# Patient Record
Sex: Female | Born: 1950
Health system: Southern US, Community
[De-identification: ages and names within clinical notes are randomized; demographics above are authoritative.]

## PROBLEM LIST (undated history)

## (undated) DIAGNOSIS — E079 Disorder of thyroid, unspecified: Secondary | ICD-10-CM

## (undated) DIAGNOSIS — I10 Essential (primary) hypertension: Secondary | ICD-10-CM

## (undated) DIAGNOSIS — Z9889 Other specified postprocedural states: Secondary | ICD-10-CM

## (undated) DIAGNOSIS — R112 Nausea with vomiting, unspecified: Secondary | ICD-10-CM

## (undated) DIAGNOSIS — E119 Type 2 diabetes mellitus without complications: Secondary | ICD-10-CM

## (undated) DIAGNOSIS — K219 Gastro-esophageal reflux disease without esophagitis: Secondary | ICD-10-CM

## (undated) HISTORY — PX: WISDOM TOOTH EXTRACTION: SHX21

## (undated) HISTORY — DX: Disorder of thyroid, unspecified: E07.9

---

## 1999-06-04 ENCOUNTER — Other Ambulatory Visit: Admission: RE | Admit: 1999-06-04 | Discharge: 1999-06-04 | Payer: Self-pay | Admitting: Obstetrics and Gynecology

## 2000-10-22 ENCOUNTER — Other Ambulatory Visit: Admission: RE | Admit: 2000-10-22 | Discharge: 2000-10-22 | Payer: Self-pay | Admitting: Obstetrics and Gynecology

## 2000-11-06 ENCOUNTER — Encounter: Admission: RE | Admit: 2000-11-06 | Discharge: 2001-02-04 | Payer: Self-pay | Admitting: Family Medicine

## 2002-04-01 ENCOUNTER — Other Ambulatory Visit: Admission: RE | Admit: 2002-04-01 | Discharge: 2002-04-01 | Payer: Self-pay | Admitting: Obstetrics and Gynecology

## 2003-07-22 ENCOUNTER — Ambulatory Visit (HOSPITAL_COMMUNITY): Admission: RE | Admit: 2003-07-22 | Discharge: 2003-07-22 | Payer: Self-pay | Admitting: Gastroenterology

## 2003-11-10 ENCOUNTER — Other Ambulatory Visit: Admission: RE | Admit: 2003-11-10 | Discharge: 2003-11-10 | Payer: Self-pay | Admitting: Obstetrics and Gynecology

## 2004-12-17 ENCOUNTER — Other Ambulatory Visit: Admission: RE | Admit: 2004-12-17 | Discharge: 2004-12-17 | Payer: Self-pay | Admitting: Obstetrics and Gynecology

## 2005-04-01 ENCOUNTER — Ambulatory Visit (HOSPITAL_COMMUNITY): Admission: RE | Admit: 2005-04-01 | Discharge: 2005-04-01 | Payer: Self-pay | Admitting: Obstetrics and Gynecology

## 2005-12-23 ENCOUNTER — Other Ambulatory Visit: Admission: RE | Admit: 2005-12-23 | Discharge: 2005-12-23 | Payer: Self-pay | Admitting: Obstetrics and Gynecology

## 2007-01-05 ENCOUNTER — Ambulatory Visit (HOSPITAL_COMMUNITY): Admission: RE | Admit: 2007-01-05 | Discharge: 2007-01-05 | Payer: Self-pay | Admitting: Obstetrics and Gynecology

## 2007-01-07 ENCOUNTER — Ambulatory Visit (HOSPITAL_COMMUNITY): Admission: RE | Admit: 2007-01-07 | Discharge: 2007-01-07 | Payer: Self-pay | Admitting: Obstetrics and Gynecology

## 2008-01-19 ENCOUNTER — Ambulatory Visit (HOSPITAL_COMMUNITY): Admission: RE | Admit: 2008-01-19 | Discharge: 2008-01-19 | Payer: Self-pay | Admitting: Obstetrics and Gynecology

## 2009-02-07 ENCOUNTER — Encounter: Admission: RE | Admit: 2009-02-07 | Discharge: 2009-02-07 | Payer: Self-pay | Admitting: Obstetrics and Gynecology

## 2009-11-11 HISTORY — PX: CATARACT EXTRACTION W/ INTRAOCULAR LENS  IMPLANT, BILATERAL: SHX1307

## 2010-02-09 ENCOUNTER — Ambulatory Visit (HOSPITAL_COMMUNITY): Admission: RE | Admit: 2010-02-09 | Discharge: 2010-02-09 | Payer: Self-pay | Admitting: Obstetrics and Gynecology

## 2011-01-15 ENCOUNTER — Other Ambulatory Visit (HOSPITAL_COMMUNITY): Payer: Self-pay | Admitting: Obstetrics and Gynecology

## 2011-01-15 DIAGNOSIS — Z1231 Encounter for screening mammogram for malignant neoplasm of breast: Secondary | ICD-10-CM

## 2011-02-12 ENCOUNTER — Ambulatory Visit (HOSPITAL_COMMUNITY)
Admission: RE | Admit: 2011-02-12 | Discharge: 2011-02-12 | Disposition: A | Discharge status: Home / Self Care | Payer: 59 | Source: Ambulatory Visit | Attending: Obstetrics and Gynecology | Admitting: Obstetrics and Gynecology

## 2011-02-12 DIAGNOSIS — Z1231 Encounter for screening mammogram for malignant neoplasm of breast: Secondary | ICD-10-CM | POA: Insufficient documentation

## 2011-03-29 NOTE — Op Note (Signed)
   NAME:  Kiara Kelley, Kiara Kelley                          ACCOUNT NO.:  [id number]   MEDICAL RECORD NO.:  [id number]                   PATIENT TYPE:  AMB   LOCATION:  ENDO                                 FACILITY:  [facility]   PHYSICIAN:  Danise Edge, M.D.                DATE OF BIRTH:  13-Nov-1950   DATE OF PROCEDURE:  07/22/2003  DATE OF DISCHARGE:                                 OPERATIVE REPORT   PROCEDURE:  Screening colonoscopy.   INDICATIONS FOR PROCEDURE:  Kiara Kelley is a 60 year old scheduled to  undergo her first screening colonoscopy and polypectomy to prevent colon  cancer.   ENDOSCOPIST:  Charolett Bumpers, M.D.   PREMEDICATION:  Versed 10 mg, Demerol 50 mg .   DESCRIPTION OF PROCEDURE:  After obtaining informed consent, [name] was  placed in the left lateral decubitus position. I administered intravenous  Demerol and intravenous Versed to achieve conscious sedation for the  procedure. The patient's blood pressure, oxygen saturation and cardiac  rhythm were monitored throughout the procedure and documented in the medical  record.   Anal inspection was normal. Digital rectal exam was normal. The Olympus  pediatric colonoscope was introduced into the rectum and with some  difficulty, due to left colon loop formation, the tip of the instrument  eventually advanced to the cecum. Colonic preparation for the exam today was  satisfactory.   RECTUM:  Normal.   SIGMOID COLON AND DESCENDING COLON:  A few colonic diverticula are present  without diverticulitis or diverticular stricture formation.   SPLENIC FLEXURE:  Normal.   TRANSVERSE COLON:  Normal.   HEPATIC FLEXURE:  Normal.   ASCENDING COLON:  Normal.   CECUM AND ILEOCECAL VALVE:  Normal.    ASSESSMENT:  Normal screening proctocolonoscopy to the cecum. No endoscopic  evidence for the presence of colorectal neoplasia. A few small diverticula  noted in the left colon.        Danise Edge, M.D.    MJ/MEDQ  D:  07/22/2003  T:  07/23/2003  Job:  [id number]   cc:   Hal Morales, M.D.  [address]  [place]  [place] [address]  Fax: [phone]   Donia Guiles, M.D.  [address] [place]  [place] [address]  Fax: [phone]

## 2012-02-19 ENCOUNTER — Other Ambulatory Visit: Payer: Self-pay | Admitting: Obstetrics and Gynecology

## 2012-02-19 DIAGNOSIS — Z1231 Encounter for screening mammogram for malignant neoplasm of breast: Secondary | ICD-10-CM

## 2012-03-12 ENCOUNTER — Encounter: Payer: Self-pay | Admitting: Obstetrics and Gynecology

## 2012-03-16 ENCOUNTER — Ambulatory Visit (HOSPITAL_COMMUNITY)
Admission: RE | Admit: 2012-03-16 | Discharge: 2012-03-16 | Disposition: A | Discharge status: Home / Self Care | Payer: 59 | Source: Ambulatory Visit | Attending: Obstetrics and Gynecology | Admitting: Obstetrics and Gynecology

## 2012-03-16 DIAGNOSIS — Z1231 Encounter for screening mammogram for malignant neoplasm of breast: Secondary | ICD-10-CM | POA: Insufficient documentation

## 2012-06-03 ENCOUNTER — Encounter: Payer: Self-pay | Admitting: Obstetrics and Gynecology

## 2012-11-01 ENCOUNTER — Ambulatory Visit: Payer: 59 | Admitting: Family Medicine

## 2012-11-01 ENCOUNTER — Ambulatory Visit: Payer: 59

## 2012-11-01 VITALS — BP 132/66 | HR 94 | Temp 99.4°F | Resp 16 | Ht 63.5 in | Wt 166.0 lb

## 2012-11-01 DIAGNOSIS — J111 Influenza due to unidentified influenza virus with other respiratory manifestations: Secondary | ICD-10-CM

## 2012-11-01 DIAGNOSIS — R05 Cough: Secondary | ICD-10-CM

## 2012-11-01 DIAGNOSIS — R059 Cough, unspecified: Secondary | ICD-10-CM

## 2012-11-01 DIAGNOSIS — E119 Type 2 diabetes mellitus without complications: Secondary | ICD-10-CM

## 2012-11-01 DIAGNOSIS — R6889 Other general symptoms and signs: Secondary | ICD-10-CM

## 2012-11-01 LAB — POCT CBC
Lymph, poc: 0.8 (ref 0.6–3.4)
MID (cbc): 0.4 (ref 0–0.9)
MPV: 9.4 fL (ref 0–99.8)
POC LYMPH PERCENT: 13.9 %L (ref 10–50)
Platelet Count, POC: 246 10*3/uL (ref 142–424)
RBC: 4.6 M/uL (ref 4.04–5.48)
RDW, POC: 13.2 %

## 2012-11-01 LAB — POCT INFLUENZA A/B: Influenza A, POC: NEGATIVE

## 2012-11-01 LAB — GLUCOSE, POCT (MANUAL RESULT ENTRY): POC Glucose: 139 mg/dl — AB (ref 70–99)

## 2012-11-01 MED ORDER — IPRATROPIUM BROMIDE 0.03 % NA SOLN: 2.0000 | Freq: Two times a day (BID) | NASAL | Status: DC

## 2012-11-01 MED ORDER — MUCINEX DM 30-600 MG PO TB12: 1.0000 | ORAL_TABLET | Freq: Two times a day (BID) | ORAL | Status: DC

## 2012-11-01 MED ORDER — AZITHROMYCIN 250 MG PO TABS: ORAL_TABLET | ORAL | Status: DC

## 2012-11-01 NOTE — Patient Instructions (Addendum)
1. Cough  POCT CBC, POCT glucose (manual entry), DG Chest 2 View  2. Flu-like symptoms  POCT rapid strep A, POCT Influenza A/B

## 2012-11-01 NOTE — Progress Notes (Signed)
[address]   [place], [place]  [address]   [phone]  Subjective:    Patient ID: Kiara Kelley, female    DOB: [birth year], 61 y.o.   MRN: [id number]  HPIThis 61 y.o. female presents for evaluation of acute illness.  Onset two days ago. Coughed all night long with every movement; extremely sore in chest from coughing.  No fever but +chills; no sweats.   +HA.  No ear pain but +congestion.  +ST; no pain with swallowing; +irritated.  No rhinorrhea; +congestion; +PND.  +coughing a lot.  No sputum.  No SOB.  Dry hacking cough.  No v/d.  +nausea.  S/p flu vaccine.  Delsym and Advil sinus congestion.  +sick contacts at work with strep, GI bug, cough.  Did not check blood sugar this morning.   Review of Systems  Constitutional: Positive for chills and fatigue. Negative for fever and diaphoresis.  HENT: Positive for congestion, sore throat, rhinorrhea, voice change and postnasal drip. Negative for ear pain, trouble swallowing and sinus pressure.   Respiratory: Positive for cough. Negative for shortness of breath and wheezing.   Gastrointestinal: Positive for nausea. Negative for vomiting and diarrhea.  Skin: Negative for rash.  Neurological: Negative for dizziness, light-headedness and headaches.    Past Medical History  Diagnosis Date  . Diabetes mellitus without complication     History reviewed. No pertinent past surgical history.  Prior to Admission medications   Medication Sig Start Date End Date Taking? Authorizing Provider  colesevelam (WELCHOL) 625 MG tablet Take 1,875 mg by mouth 2 (two) times daily with a meal.   Yes Historical Provider, MD  ezetimibe-simvastatin (VYTORIN) 10-20 MG per tablet Take 1 tablet by mouth at bedtime.   Yes Historical Provider, MD  glyBURIDE-metformin (GLUCOVANCE) 1.25-250 MG per tablet Take 2 tablets by mouth 2 (two) times daily.   Yes Historical Provider, MD  lisinopril (PRINIVIL,ZESTRIL) 10 MG tablet Take 10 mg by mouth daily.   Yes Historical  Provider, MD  sitaGLIPtin (JANUVIA) 100 MG tablet Take 100 mg by mouth daily.   Yes Historical Provider, MD    No Known Allergies  History   Social History  . Marital Status: Married    Spouse Name: N/A    Number of Children: N/A  . Years of Education: N/A   Occupational History  . Not on file.   Social History Main Topics  . Smoking status: Never Smoker   . Smokeless tobacco: Not on file  . Alcohol Use: No  . Drug Use: No  . Sexually Active: Yes   Other Topics Concern  . 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.    Family History  Problem Relation Age of Onset  . Heart disease Mother   . Lung cancer Father   . Diabetes Brother         Objective:   Physical Exam  Nursing note and vitals reviewed. Constitutional: She is oriented to person, place, and time. She appears well-developed and well-nourished. No distress.  HENT:  Head: Normocephalic and atraumatic.  Right Ear: External ear normal.  Left Ear: External ear normal.  Nose: Nose normal.  Mouth/Throat: Oropharynx is clear and moist. No oropharyngeal exudate.  Eyes: Conjunctivae normal are normal. Pupils are equal, round, and reactive to light.  Neck: Normal range of motion. Neck supple.  Cardiovascular: Normal rate, regular rhythm and normal heart sounds.   No murmur heard. Pulmonary/Chest: Effort normal and  breath sounds normal. No respiratory distress. She has no wheezes. She has no rales.  Lymphadenopathy:    She has no cervical adenopathy.  Neurological: She is alert and oriented to person, place, and time.  Skin: Skin is warm and dry. No rash noted. She is not diaphoretic.  Psychiatric: She has a normal mood and affect. Her behavior is normal.   Results for orders placed in visit on 11/01/12  POCT CBC      Component Value Range   WBC 5.7  4.6 - 10.2 K/uL   Lymph, poc 0.8  0.6 - 3.4   POC LYMPH PERCENT 13.9  10 - 50 %L   MID (cbc) 0.4  0 - 0.9   POC MID % 6.6   0 - 12 %M   POC Granulocyte 4.5  2 - 6.9   Granulocyte percent 79.5  37 - 80 %G   RBC 4.60  4.04 - 5.48 M/uL   Hemoglobin 12.7  12.2 - 16.2 g/dL   HCT, POC 45.4  09.8 - 47.9 %   MCV 90.5  80 - 97 fL   MCH, POC 27.6  27 - 31.2 pg   MCHC 30.5 (*) 31.8 - 35.4 g/dL   RDW, POC 11.9     Platelet Count, POC 246  142 - 424 K/uL   MPV 9.4  0 - 99.8 fL  GLUCOSE, POCT (MANUAL RESULT ENTRY)      Component Value Range   POC Glucose 139 (*) 70 - 99 mg/dl  POCT RAPID STREP A (OFFICE)      Component Value Range   Rapid Strep A Screen Negative  Negative  POCT INFLUENZA A/B      Component Value Range   Influenza A, POC Negative     Influenza B, POC Negative     [facility] reading (PRIMARY) by  Dr. Katrinka Blazing.  CXR: NAD      Assessment & Plan:   1. Cough  POCT CBC, POCT glucose (manual entry), DG Chest 2 View  2. Flu-like symptoms  POCT rapid strep A, POCT Influenza A/B  3. Type II or unspecified type diabetes mellitus without mention of complication, not stated as uncontrolled       1.  URI:  New.  Treat supportively with rest, fluids, Ibuprofen or Tylenol. Rx for Zithromax and Mucinex DM and Atrovent nasal spray.  Likely viral syndrome yet due to DMII, exposure to multiple immunosuppressed family members, will treat with Zithromax. 2.  DMII:  Stable/controlled; advised to monitor sugars closely with acute illness.    Meds ordered this encounter  Medications  . ezetimibe-simvastatin (VYTORIN) 10-20 MG per tablet    Sig: Take 1 tablet by mouth at bedtime.  Marland Kitchen lisinopril (PRINIVIL,ZESTRIL) 10 MG tablet    Sig: Take 10 mg by mouth daily.  Marland Kitchen glyBURIDE-metformin (GLUCOVANCE) 1.25-250 MG per tablet    Sig: Take 2 tablets by mouth 2 (two) times daily.  . sitaGLIPtin (JANUVIA) 100 MG tablet    Sig: Take 100 mg by mouth daily.  . colesevelam (WELCHOL) 625 MG tablet    Sig: Take 1,875 mg by mouth 2 (two) times daily with a meal.  . azithromycin (ZITHROMAX) 250 MG tablet    Sig: Two tablets daily x 5 days     Dispense:  10 tablet    Refill:  0  . Dextromethorphan-Guaifenesin (MUCINEX DM) 30-600 MG TB12    Sig: Take 1 tablet by mouth 2 (two) times daily.    Dispense:  28  each    Refill:  0  . ipratropium (ATROVENT) 0.03 % nasal spray    Sig: Place 2 sprays into the nose 2 (two) times daily.    Dispense:  30 mL    Refill:  5

## 2012-11-06 NOTE — Progress Notes (Signed)
Reviewed and agree.

## 2013-06-09 ENCOUNTER — Other Ambulatory Visit: Payer: Self-pay | Admitting: Obstetrics and Gynecology

## 2013-06-09 DIAGNOSIS — Z1231 Encounter for screening mammogram for malignant neoplasm of breast: Secondary | ICD-10-CM

## 2013-06-11 ENCOUNTER — Ambulatory Visit (HOSPITAL_COMMUNITY)
Admission: RE | Admit: 2013-06-11 | Discharge: 2013-06-11 | Disposition: A | Discharge status: Home / Self Care | Payer: 59 | Source: Ambulatory Visit | Attending: Obstetrics and Gynecology | Admitting: Obstetrics and Gynecology

## 2013-06-11 DIAGNOSIS — Z1231 Encounter for screening mammogram for malignant neoplasm of breast: Secondary | ICD-10-CM | POA: Insufficient documentation

## 2013-08-26 ENCOUNTER — Other Ambulatory Visit: Payer: Self-pay | Admitting: Gastroenterology

## 2013-08-31 ENCOUNTER — Encounter (HOSPITAL_COMMUNITY): Payer: Self-pay | Admitting: *Deleted

## 2013-08-31 ENCOUNTER — Encounter (HOSPITAL_COMMUNITY): Payer: Self-pay | Admitting: Pharmacy Technician

## 2013-09-14 ENCOUNTER — Ambulatory Visit (HOSPITAL_COMMUNITY)
Admission: RE | Admit: 2013-09-14 | Discharge: 2013-09-14 | Disposition: A | Discharge status: Home / Self Care | Payer: 59 | Source: Ambulatory Visit | Attending: Gastroenterology | Admitting: Gastroenterology

## 2013-09-14 ENCOUNTER — Encounter (HOSPITAL_COMMUNITY): Payer: Self-pay | Admitting: *Deleted

## 2013-09-14 ENCOUNTER — Ambulatory Visit (HOSPITAL_COMMUNITY): Payer: 59 | Admitting: Anesthesiology

## 2013-09-14 ENCOUNTER — Encounter (HOSPITAL_COMMUNITY)
Admission: RE | Disposition: A | Discharge status: Home / Self Care | Payer: Self-pay | Source: Ambulatory Visit | Attending: Gastroenterology

## 2013-09-14 ENCOUNTER — Encounter (HOSPITAL_COMMUNITY): Payer: 59 | Admitting: Anesthesiology

## 2013-09-14 DIAGNOSIS — E11319 Type 2 diabetes mellitus with unspecified diabetic retinopathy without macular edema: Secondary | ICD-10-CM | POA: Insufficient documentation

## 2013-09-14 DIAGNOSIS — K219 Gastro-esophageal reflux disease without esophagitis: Secondary | ICD-10-CM | POA: Insufficient documentation

## 2013-09-14 DIAGNOSIS — Z1211 Encounter for screening for malignant neoplasm of colon: Secondary | ICD-10-CM | POA: Insufficient documentation

## 2013-09-14 DIAGNOSIS — I10 Essential (primary) hypertension: Secondary | ICD-10-CM | POA: Insufficient documentation

## 2013-09-14 DIAGNOSIS — E1139 Type 2 diabetes mellitus with other diabetic ophthalmic complication: Secondary | ICD-10-CM | POA: Insufficient documentation

## 2013-09-14 DIAGNOSIS — E7800 Pure hypercholesterolemia, unspecified: Secondary | ICD-10-CM | POA: Insufficient documentation

## 2013-09-14 HISTORY — PX: COLONOSCOPY WITH PROPOFOL: SHX5780

## 2013-09-14 HISTORY — DX: Gastro-esophageal reflux disease without esophagitis: K21.9

## 2013-09-14 HISTORY — DX: Essential (primary) hypertension: I10

## 2013-09-14 LAB — GLUCOSE, CAPILLARY: Glucose-Capillary: 103 mg/dL — ABNORMAL HIGH (ref 70–99)

## 2013-09-14 SURGERY — COLONOSCOPY WITH PROPOFOL
Anesthesia: Monitor Anesthesia Care

## 2013-09-14 MED ORDER — PROPOFOL INFUSION 10 MG/ML OPTIME
INTRAVENOUS | Status: DC | PRN
  Administered 2013-09-14: 120 ug/kg/min via INTRAVENOUS

## 2013-09-14 MED ORDER — LACTATED RINGERS IV SOLN
INTRAVENOUS | Status: DC
  Administered 2013-09-14: 1000 mL via INTRAVENOUS

## 2013-09-14 MED ORDER — FENTANYL CITRATE 0.05 MG/ML IJ SOLN: 25.0000 ug | INTRAMUSCULAR | Status: DC | PRN

## 2013-09-14 MED ORDER — SODIUM CHLORIDE 0.9 % IV SOLN: INTRAVENOUS | Status: DC

## 2013-09-14 MED ORDER — GLYCOPYRROLATE 0.2 MG/ML IJ SOLN
INTRAMUSCULAR | Status: DC | PRN
  Administered 2013-09-14: 0.2 mg via INTRAVENOUS

## 2013-09-14 MED ORDER — MIDAZOLAM HCL 5 MG/5ML IJ SOLN
INTRAMUSCULAR | Status: DC | PRN
  Administered 2013-09-14: 2 mg via INTRAVENOUS

## 2013-09-14 MED ORDER — KETAMINE HCL 10 MG/ML IJ SOLN
INTRAMUSCULAR | Status: DC | PRN
  Administered 2013-09-14: 25 mg via INTRAVENOUS

## 2013-09-14 MED ORDER — PROPOFOL 10 MG/ML IV BOLUS
INTRAVENOUS | Status: DC | PRN
  Administered 2013-09-14: 5 mg via INTRAVENOUS

## 2013-09-14 SURGICAL SUPPLY — 21 items

## 2013-09-14 NOTE — Anesthesia Postprocedure Evaluation (Signed)
  Anesthesia Post-op Note  Patient: Kiara Kelley  Procedure(s) Performed: Procedure(s) (LRB): COLONOSCOPY WITH PROPOFOL (N/A)  Patient Location: PACU  Anesthesia Type: MAC  Level of Consciousness: awake and alert   Airway and Oxygen Therapy: Patient Spontanous Breathing  Post-op Pain: mild  Post-op Assessment: Post-op Vital signs reviewed, Patient's Cardiovascular Status Stable, Respiratory Function Stable, Patent Airway and No signs of Nausea or vomiting  Last Vitals:  Filed Vitals:   09/14/13 1203  BP: 122/76  Pulse: 80  Temp: 36.7 C  Resp: 30    Post-op Vital Signs: stable   Complications: No apparent anesthesia complications

## 2013-09-14 NOTE — Transfer of Care (Signed)
Immediate Anesthesia Transfer of Care Note  Patient: Kiara Kelley  Procedure(s) Performed: Procedure(s): COLONOSCOPY WITH PROPOFOL (N/A)  Patient Location: PACU, ENDO  Anesthesia Type:MAC  Level of Consciousness: Patient easily awoken, sedated, comfortable, cooperative, following commands, responds to stimulation.   Airway & Oxygen Therapy: Patient spontaneously breathing, ventilating well, oxygen via simple oxygen mask.  Post-op Assessment: Report given to PACU RN, vital signs reviewed and stable, moving all extremities.   Post vital signs: Reviewed and stable.  Complications: No apparent anesthesia complications

## 2013-09-14 NOTE — Op Note (Signed)
Procedure: Screening colonoscopy. Normal screening colonoscopy in September 2004.  Endoscopist: Brett Soza  Premedication: Propofol administered by anesthesia  Procedure: The patient was placed in the left lateral decubitus position. Anal inspection and digital rectal exam were normal. Pentax pediatric colonoscope was introduced into the rectum and advanced to the cecum. A normal-appearing ileocecal valve and appendiceal orifice were identified. Colonic preparation for the exam today was good.  Rectum. Normal. Retroflex view of the distal rectum normal.  Sigmoid colon and descending colon. Normal  Splenic flexure. Normal  Transverse colon. Normal  Hepatic flexure. Normal  Ascending colon. Normal  Cecum and ileocecal valve. Normal  Assessment: Normal screening proctocolonoscopy to the cecum

## 2013-09-14 NOTE — Anesthesia Preprocedure Evaluation (Addendum)
Anesthesia Evaluation  Patient identified by MRN, date of birth, ID band Patient awake    Reviewed: Allergy & Precautions, H&P , NPO status , Patient's Chart, lab work & pertinent test results  Airway Mallampati: II TM Distance: >3 FB Neck ROM: full    Dental no notable dental hx. (+) Teeth Intact and Dental Advisory Given   Pulmonary neg pulmonary ROS,  breath sounds clear to auscultation  Pulmonary exam normal       Cardiovascular Exercise Tolerance: Good hypertension, Pt. on medications Rhythm:regular Rate:Normal     Neuro/Psych negative neurological ROS  negative psych ROS   GI/Hepatic negative GI ROS, Neg liver ROS,   Endo/Other  diabetes, Well Controlled, Type 2, Oral Hypoglycemic Agents  Renal/GU negative Renal ROS  negative genitourinary   Musculoskeletal   Abdominal   Peds  Hematology negative hematology ROS (+)   Anesthesia Other Findings   Reproductive/Obstetrics negative OB ROS                          Anesthesia Physical Anesthesia Plan  ASA: III  Anesthesia Plan: MAC   Post-op Pain Management:    Induction:   Airway Management Planned: Simple Face Mask  Additional Equipment:   Intra-op Plan:   Post-operative Plan:   Informed Consent: I have reviewed the patients History and Physical, chart, labs and discussed the procedure including the risks, benefits and alternatives for the proposed anesthesia with the patient or authorized representative who has indicated his/her understanding and acceptance.   Dental Advisory Given  Plan Discussed with: CRNA and Surgeon  Anesthesia Plan Comments:         Anesthesia Quick Evaluation

## 2013-09-14 NOTE — Preoperative (Signed)
Beta Blockers   Reason not to administer Beta Blockers:Not Applicable, not on home BB 

## 2013-09-14 NOTE — H&P (Signed)
Procedure: Screening colonoscopy  History: The patient is a 62 year old female born 1951-06-19. The patient underwent a normal screening colonoscopy in September 2004.  The patient is scheduled to undergo a repeat screening colonoscopy today.  Past medical history: Type 2 diabetes mellitus with  retinopathy. Hypertension. Hypercholesterolemia. Psoriasis. Gastroesophageal reflux. Cataract surgery.  Medication allergies: None  Exam: The patient is alert and lying comfortably on the endoscopy stretcher. Abdomen is soft and nontender to palpation. Lungs are clear to auscultation. Cardiac exam reveals a regular rhythm.  Plan: Proceed with screening colonoscopy using propofol sedation

## 2013-09-15 ENCOUNTER — Encounter (HOSPITAL_COMMUNITY): Payer: Self-pay | Admitting: Gastroenterology

## 2014-08-10 ENCOUNTER — Other Ambulatory Visit (HOSPITAL_COMMUNITY): Payer: Self-pay | Admitting: Obstetrics and Gynecology

## 2014-08-10 DIAGNOSIS — Z1231 Encounter for screening mammogram for malignant neoplasm of breast: Secondary | ICD-10-CM

## 2014-08-19 ENCOUNTER — Ambulatory Visit (HOSPITAL_COMMUNITY)
Admission: RE | Admit: 2014-08-19 | Discharge: 2014-08-19 | Disposition: A | Discharge status: Home / Self Care | Payer: BC Managed Care – PPO | Source: Ambulatory Visit | Attending: Obstetrics and Gynecology | Admitting: Obstetrics and Gynecology

## 2014-08-19 DIAGNOSIS — Z1231 Encounter for screening mammogram for malignant neoplasm of breast: Secondary | ICD-10-CM | POA: Diagnosis present

## 2016-09-25 ENCOUNTER — Ambulatory Visit (INDEPENDENT_AMBULATORY_CARE_PROVIDER_SITE_OTHER): Payer: BLUE CROSS/BLUE SHIELD | Admitting: Podiatry

## 2016-09-25 ENCOUNTER — Encounter: Payer: Self-pay | Admitting: Podiatry

## 2016-09-25 ENCOUNTER — Ambulatory Visit (INDEPENDENT_AMBULATORY_CARE_PROVIDER_SITE_OTHER): Payer: BLUE CROSS/BLUE SHIELD

## 2016-09-25 VITALS — BP 138/78 | HR 76 | Resp 16

## 2016-09-25 DIAGNOSIS — M722 Plantar fascial fibromatosis: Secondary | ICD-10-CM | POA: Diagnosis not present

## 2016-09-25 DIAGNOSIS — M79671 Pain in right foot: Secondary | ICD-10-CM | POA: Diagnosis not present

## 2016-09-25 MED ORDER — MELOXICAM 15 MG PO TABS: 15.0000 mg | ORAL_TABLET | Freq: Every day | ORAL | 1 refills | Status: AC

## 2016-09-25 NOTE — Progress Notes (Signed)
   Subjective:    Patient ID: Kiara Kelley, female    DOB: May 19, 1951, 65 y.o.   MRN: [id number]  HPI    Review of Systems  Musculoskeletal: Positive for arthralgias and gait problem.  All other systems reviewed and are negative.      Objective:   Physical Exam        Assessment & Plan:

## 2016-10-19 MED ORDER — BETAMETHASONE SOD PHOS & ACET 6 (3-3) MG/ML IJ SUSP: 3.0000 mg | Freq: Once | INTRAMUSCULAR | Status: DC

## 2016-10-19 NOTE — Progress Notes (Signed)
Patient ID: Kiara Kelley, female   DOB: 1951/10/14, 65 y.o.   MRN: [id number] Subjective: Patient presents today for pain and tenderness in the right foot. Patient states the foot pain has been hurting for several weeks now. Patient states that it hurts in the mornings with the first steps out of bed. Patient presents today for further treatment and evaluation  Objective: Physical Exam General: The patient is alert and oriented x3 in no acute distress.  Dermatology: Skin is warm, dry and supple bilateral lower extremities. Negative for open lesions or macerations bilateral.   Vascular: Dorsalis Pedis and Posterior Tibial pulses palpable bilateral.  Capillary fill time is immediate to all digits.  Neurological: Epicritic and protective threshold intact bilateral.   Musculoskeletal: Tenderness to palpation at the medial calcaneal tubercale and through the insertion of the plantar fascia of the right foot. All other joints range of motion within normal limits bilateral. Strength 5/5 in all groups bilateral.   Radiographic exam: Normal osseous mineralization. Joint spaces preserved. No fracture/dislocation/boney destruction. Calcaneal spur present with mild thickening of plantar fascia right. No other soft tissue abnormalities or radiopaque foreign bodies.   Assessment: 1. Plantar fasciitis right 2. Pain in right foot  Plan of Care:  1. Patient evaluated. Xrays reviewed.   2. Injection of 0.5cc Celestone soluspan injected into the right heel at the insertion of the plantar fascia.  3. Instructed patient regarding therapies and modalities at home to alleviate symptoms.  4. Rx for meloxicam dispensed   5. Plantar fascial band(s) dispensed. 6. Return to clinic in 4 weeks.

## 2016-10-23 ENCOUNTER — Ambulatory Visit (INDEPENDENT_AMBULATORY_CARE_PROVIDER_SITE_OTHER): Payer: BLUE CROSS/BLUE SHIELD | Admitting: Podiatry

## 2016-10-23 DIAGNOSIS — M722 Plantar fascial fibromatosis: Secondary | ICD-10-CM | POA: Diagnosis not present

## 2016-10-23 DIAGNOSIS — M79671 Pain in right foot: Secondary | ICD-10-CM

## 2016-10-25 NOTE — Progress Notes (Signed)
Patient ID: Kiara Kelley, female   DOB: 1951-10-16, 65 y.o.   MRN: [id number] Subjective: For follow-up evaluation of plantar fasciitis to the right foot. Patient states that she is a whole lot better. Patient along experiences pain. Patient states that the injections helped as well as the plantar fascial bands. Patient presents today for follow-up treatment and evaluation  Objective: Physical Exam General: The patient is alert and oriented x3 in no acute distress.  Dermatology: Skin is warm, dry and supple bilateral lower extremities. Negative for open lesions or macerations bilateral.   Vascular: Dorsalis Pedis and Posterior Tibial pulses palpable bilateral.  Capillary fill time is immediate to all digits.  Neurological: Epicritic and protective threshold intact bilateral.   Musculoskeletal: negative for tenderness to palpation at the medial calcaneal tubercale and through the insertion of the plantar fascia of the right foot. All other joints range of motion within normal limits bilateral. Strength 5/5 in all groups bilateral.   Radiographic exam: Normal osseous mineralization. Joint spaces preserved. No fracture/dislocation/boney destruction. Calcaneal spur present with mild thickening of plantar fascia right. No other soft tissue abnormalities or radiopaque foreign bodies.   Assessment: 1. Plantar fasciitis right - resolved 2. Pain in right foot  Plan of Care:  1. Patient evaluated.   2. Today the patient was scanned for custom molded orthotics 3. Return to clinic in 3 weeks orthotics pickup 4. Discussed preventative measures to prevent recurrent episodes of plantar fasciitis including shoe gear modification, supportive insoles, stretching exercises

## 2016-11-13 ENCOUNTER — Ambulatory Visit: Payer: BLUE CROSS/BLUE SHIELD | Admitting: Podiatry

## 2016-11-18 ENCOUNTER — Ambulatory Visit (INDEPENDENT_AMBULATORY_CARE_PROVIDER_SITE_OTHER): Payer: Self-pay | Admitting: Podiatry

## 2016-11-18 DIAGNOSIS — M722 Plantar fascial fibromatosis: Secondary | ICD-10-CM

## 2016-11-18 NOTE — Progress Notes (Signed)
Patient ID: Kiara Kelley, female   DOB: 1951-01-18, 66 y.o.   MRN: [id number]   Patient presents for orthotic pick up.  Verbal and written break in and wear instructions given.  Patient will follow up in 4 weeks if symptoms worsen or fail to improve.

## 2016-11-18 NOTE — Patient Instructions (Signed)

## 2016-12-16 ENCOUNTER — Ambulatory Visit: Payer: BLUE CROSS/BLUE SHIELD | Admitting: Podiatry

## 2017-05-15 DIAGNOSIS — E039 Hypothyroidism, unspecified: Secondary | ICD-10-CM | POA: Diagnosis not present

## 2017-05-15 DIAGNOSIS — Z0000 Encounter for general adult medical examination without abnormal findings: Secondary | ICD-10-CM | POA: Diagnosis not present

## 2017-05-15 DIAGNOSIS — Z7984 Long term (current) use of oral hypoglycemic drugs: Secondary | ICD-10-CM | POA: Diagnosis not present

## 2017-05-15 DIAGNOSIS — E782 Mixed hyperlipidemia: Secondary | ICD-10-CM | POA: Diagnosis not present

## 2017-05-15 DIAGNOSIS — E1165 Type 2 diabetes mellitus with hyperglycemia: Secondary | ICD-10-CM | POA: Diagnosis not present

## 2017-05-15 DIAGNOSIS — K219 Gastro-esophageal reflux disease without esophagitis: Secondary | ICD-10-CM | POA: Diagnosis not present

## 2017-05-15 DIAGNOSIS — Z1159 Encounter for screening for other viral diseases: Secondary | ICD-10-CM | POA: Diagnosis not present

## 2017-05-15 DIAGNOSIS — I10 Essential (primary) hypertension: Secondary | ICD-10-CM | POA: Diagnosis not present

## 2017-08-17 DIAGNOSIS — Z23 Encounter for immunization: Secondary | ICD-10-CM | POA: Diagnosis not present

## 2017-12-03 DIAGNOSIS — E782 Mixed hyperlipidemia: Secondary | ICD-10-CM | POA: Diagnosis not present

## 2017-12-03 DIAGNOSIS — I10 Essential (primary) hypertension: Secondary | ICD-10-CM | POA: Diagnosis not present

## 2017-12-03 DIAGNOSIS — Z7984 Long term (current) use of oral hypoglycemic drugs: Secondary | ICD-10-CM | POA: Diagnosis not present

## 2017-12-03 DIAGNOSIS — E039 Hypothyroidism, unspecified: Secondary | ICD-10-CM | POA: Diagnosis not present

## 2017-12-03 DIAGNOSIS — E113592 Type 2 diabetes mellitus with proliferative diabetic retinopathy without macular edema, left eye: Secondary | ICD-10-CM | POA: Diagnosis not present

## 2017-12-17 DIAGNOSIS — D259 Leiomyoma of uterus, unspecified: Secondary | ICD-10-CM | POA: Diagnosis not present

## 2017-12-17 DIAGNOSIS — N952 Postmenopausal atrophic vaginitis: Secondary | ICD-10-CM | POA: Diagnosis not present

## 2017-12-17 DIAGNOSIS — N950 Postmenopausal bleeding: Secondary | ICD-10-CM | POA: Diagnosis not present

## 2017-12-17 DIAGNOSIS — Z1231 Encounter for screening mammogram for malignant neoplasm of breast: Secondary | ICD-10-CM | POA: Diagnosis not present

## 2017-12-17 DIAGNOSIS — Z124 Encounter for screening for malignant neoplasm of cervix: Secondary | ICD-10-CM | POA: Diagnosis not present

## 2017-12-17 DIAGNOSIS — Z01411 Encounter for gynecological examination (general) (routine) with abnormal findings: Secondary | ICD-10-CM | POA: Diagnosis not present

## 2017-12-17 DIAGNOSIS — Z6826 Body mass index (BMI) 26.0-26.9, adult: Secondary | ICD-10-CM | POA: Diagnosis not present

## 2017-12-17 DIAGNOSIS — N909 Noninflammatory disorder of vulva and perineum, unspecified: Secondary | ICD-10-CM | POA: Diagnosis not present

## 2018-01-14 DIAGNOSIS — N909 Noninflammatory disorder of vulva and perineum, unspecified: Secondary | ICD-10-CM | POA: Diagnosis not present

## 2018-01-14 DIAGNOSIS — I10 Essential (primary) hypertension: Secondary | ICD-10-CM | POA: Diagnosis not present

## 2018-01-14 DIAGNOSIS — D259 Leiomyoma of uterus, unspecified: Secondary | ICD-10-CM | POA: Diagnosis not present

## 2018-01-14 DIAGNOSIS — N950 Postmenopausal bleeding: Secondary | ICD-10-CM | POA: Diagnosis not present

## 2018-01-14 DIAGNOSIS — K219 Gastro-esophageal reflux disease without esophagitis: Secondary | ICD-10-CM | POA: Diagnosis not present

## 2018-01-14 DIAGNOSIS — E119 Type 2 diabetes mellitus without complications: Secondary | ICD-10-CM | POA: Diagnosis not present

## 2018-01-19 ENCOUNTER — Encounter (HOSPITAL_BASED_OUTPATIENT_CLINIC_OR_DEPARTMENT_OTHER): Payer: Self-pay | Admitting: Emergency Medicine

## 2018-01-19 ENCOUNTER — Other Ambulatory Visit: Payer: Self-pay | Admitting: Obstetrics and Gynecology

## 2018-01-21 ENCOUNTER — Other Ambulatory Visit: Payer: Self-pay

## 2018-01-21 ENCOUNTER — Encounter (HOSPITAL_BASED_OUTPATIENT_CLINIC_OR_DEPARTMENT_OTHER): Payer: Self-pay | Admitting: *Deleted

## 2018-01-21 NOTE — Progress Notes (Signed)
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

## 2018-01-23 ENCOUNTER — Encounter (HOSPITAL_COMMUNITY)
Admission: RE | Admit: 2018-01-23 | Discharge: 2018-01-23 | Disposition: A | Discharge status: Home / Self Care | Payer: Medicare Other | Source: Ambulatory Visit | Attending: Obstetrics and Gynecology | Admitting: Obstetrics and Gynecology

## 2018-01-23 ENCOUNTER — Inpatient Hospital Stay (HOSPITAL_COMMUNITY): Admission: RE | Admit: 2018-01-23 | Payer: Self-pay | Source: Ambulatory Visit

## 2018-01-23 DIAGNOSIS — Z01810 Encounter for preprocedural cardiovascular examination: Secondary | ICD-10-CM | POA: Insufficient documentation

## 2018-01-23 DIAGNOSIS — Z01812 Encounter for preprocedural laboratory examination: Secondary | ICD-10-CM | POA: Insufficient documentation

## 2018-01-23 DIAGNOSIS — E119 Type 2 diabetes mellitus without complications: Secondary | ICD-10-CM | POA: Insufficient documentation

## 2018-01-23 DIAGNOSIS — R9431 Abnormal electrocardiogram [ECG] [EKG]: Secondary | ICD-10-CM | POA: Insufficient documentation

## 2018-01-23 LAB — BASIC METABOLIC PANEL
ANION GAP: 10 (ref 5–15)
BUN: 16 mg/dL (ref 6–20)
CHLORIDE: 106 mmol/L (ref 101–111)
CO2: 23 mmol/L (ref 22–32)
Calcium: 9.2 mg/dL (ref 8.9–10.3)
Creatinine, Ser: 0.69 mg/dL (ref 0.44–1.00)
GFR calc non Af Amer: >60 mL/min (ref >60)
GLUCOSE: 91 mg/dL (ref 65–99)
Potassium: 4 mmol/L (ref 3.5–5.1)
Sodium: 139 mmol/L (ref 135–145)

## 2018-01-23 LAB — CBC
HEMATOCRIT: 42.9 % (ref 36.0–46.0)
HEMOGLOBIN: 14 g/dL (ref 12.0–15.0)
MCH: 29.5 pg (ref 26.0–34.0)
MCHC: 32.6 g/dL (ref 30.0–36.0)
MCV: 90.3 fL (ref 78.0–100.0)
Platelets: 226 10*3/uL (ref 150–400)
RBC: 4.75 MIL/uL (ref 3.87–5.11)
RDW: 13.3 % (ref 11.5–15.5)
WBC: 6.8 10*3/uL (ref 4.0–10.5)

## 2018-01-23 NOTE — Progress Notes (Signed)
Pt called via phone today questioning her case start time. Because at time of pre-op interview done on 01-21-2018  pt case was at 59 and when she came for lab work today she was told 1045. Reviewed audit trial in epic noted dr Leo Grosser office called centralized scheduling and changed pt start time 01-22-2018 to 1045. Per pt she had not received a call from dr Leo Grosser office about time change.  Pt verbalized understanding to arrive at [place] at [facility] on 01-29-2018.

## 2018-01-28 DIAGNOSIS — N950 Postmenopausal bleeding: Secondary | ICD-10-CM | POA: Diagnosis present

## 2018-01-28 DIAGNOSIS — N9089 Other specified noninflammatory disorders of vulva and perineum: Secondary | ICD-10-CM

## 2018-01-28 DIAGNOSIS — K219 Gastro-esophageal reflux disease without esophagitis: Secondary | ICD-10-CM | POA: Insufficient documentation

## 2018-01-28 DIAGNOSIS — E119 Type 2 diabetes mellitus without complications: Secondary | ICD-10-CM

## 2018-01-28 DIAGNOSIS — L409 Psoriasis, unspecified: Secondary | ICD-10-CM

## 2018-01-28 DIAGNOSIS — I10 Essential (primary) hypertension: Secondary | ICD-10-CM | POA: Diagnosis not present

## 2018-01-28 NOTE — H&P (Signed)
Kiara Kelley is an 67 y.o. female. She presents for evaluation of single episode of postmenopausal bleeding  Pertinent Gynecological History: Menses: post-menopausal Bleeding: post menopausal bleeding Contraception: post menopausal status DES exposure: denies Blood transfusions: none Sexually transmitted diseases: no past history Previous GYN Procedures: none  Last mammogram: normal Date: 12/17/17 Last pap: normal Date: 12/17/17 OB History: G3, P2   Menstrual History: Menarche age: 41 No LMP recorded. Patient is postmenopausal.    Past Medical History:  Diagnosis Date  . Complication of anesthesia   . Diabetes mellitus without complication ([place])    type 2  . GERD (gastroesophageal reflux disease)   . Hypertension     Past Surgical History:  Procedure Laterality Date  . COLONOSCOPY WITH PROPOFOL N/A 09/14/2013   Procedure: COLONOSCOPY WITH PROPOFOL;  Surgeon: Garlan Fair, MD;  Location: [facility];  Service: Endoscopy;  Laterality: N/A;  . EYE SURGERY Bilateral 2011   cataract extraction  . WISDOM TOOTH EXTRACTION      Family History  Problem Relation Age of Onset  . Heart disease Mother   . Lung cancer Father   . Diabetes Brother     Social History:  reports that she has never smoked. She has never used smokeless tobacco. She reports that she drinks alcohol. She reports that she does not use drugs.  Allergies: No Known Allergies  Medications Prior to Admission  Medication Sig Dispense Refill Last Dose  . aspirin 81 MG tablet Take 81 mg by mouth every evening. 2 tabs   01/22/2018 at Unknown time  . colesevelam (WELCHOL) 625 MG tablet Take 1,875 mg by mouth every evening.    01/28/2018 at 0700  . glimepiride (AMARYL) 4 MG tablet Take 4 mg by mouth daily with breakfast.   01/28/2018 at 0700  . lisinopril (PRINIVIL,ZESTRIL) 2.5 MG tablet Take 2.5 mg by mouth daily.   01/28/2018 at 0700  . metFORMIN (GLUCOPHAGE) 1000 MG tablet Take 1,000 mg by mouth 2 (two) times  daily with a meal.   01/28/2018 at 1900  . Omega-3 Fatty Acids (FISH OIL) 1200 MG CAPS Take by mouth. 1 in pm   01/22/2018 at Unknown time  . simvastatin (ZOCOR) 40 MG tablet Take 40 mg by mouth daily at 6 PM.   01/28/2018 at 0700  . sitaGLIPtin (JANUVIA) 100 MG tablet Take 100 mg by mouth every evening. Takes 1/2 pill in pm   01/28/2018 at 1900  . UNABLE TO FIND Vitamin d 3 2000 iu pm   01/22/2018 at Unknown time    Review of Systems  Constitutional: Negative.   HENT: Negative.   Respiratory: Negative.   Cardiovascular: Negative.   Gastrointestinal: Negative.   Genitourinary: Negative.   Musculoskeletal: Negative.   Neurological: Negative.   Endo/Heme/Allergies: Negative.   Psychiatric/Behavioral: Negative.     Blood pressure (!) 158/60, pulse 74, temperature 97.8 F (36.6 C), temperature source Oral, resp. rate 17, height 5\' 3"  (1.6 m), weight 146 lb (66.2 kg), SpO2 99 %. Physical Exam  Constitutional: She is oriented to person, place, and time. She appears well-developed and well-nourished.  HENT:  Head: Normocephalic and atraumatic.  Eyes: EOM are normal.  Neck: Normal range of motion. Neck supple.  Cardiovascular: Normal rate and regular rhythm.  Respiratory: Effort normal and breath sounds normal.  GI: Soft. Bowel sounds are normal.  Genitourinary:  Genitourinary Comments: Pelvic exam:  VULVA: vulvar lesion hypopigmented hyperplastic areas bilaterally anterior at clitoral hood.  Also hyperpigmented macule at 5 o'clock at  introitus.  Pt has s=psoriasis  VAGINA: atrophic,  CERVIX: normal appearing cervix without discharge or lesions,  UTERUS: enlarged to 10 week's size,  ADNEXA: normal adnexa in size, nontender and no masses.  Musculoskeletal: Normal range of motion.  Neurological: She is alert and oriented to person, place, and time.  Skin: Skin is warm and dry.  Psychiatric: She has a normal mood and affect.    Office ultrasound:  7 fibroids noted with largest =3.87.   Endometrial thickness=11.67mm  Assessment/Plan: Pt with episode of PMB and thickened endometrium on u/s for hysteroscopy, pollible resection, curettage. Indications, risks and benefits reviewed.  The risks of anesthesia, bleeding, infection and damage to adjacent organs reviewed.  The specific risk of uterine perforation reviewed.Pt wishes to proceed. Pt also wants to undergo biopsy of vulvar lesion at this time.  Seymour Bars Dvontae Ruan 01/29/2018, 10:41 AM

## 2018-01-29 ENCOUNTER — Ambulatory Visit (HOSPITAL_BASED_OUTPATIENT_CLINIC_OR_DEPARTMENT_OTHER): Payer: Medicare Other | Admitting: Anesthesiology

## 2018-01-29 ENCOUNTER — Encounter (HOSPITAL_BASED_OUTPATIENT_CLINIC_OR_DEPARTMENT_OTHER): Payer: Self-pay | Admitting: *Deleted

## 2018-01-29 ENCOUNTER — Encounter (HOSPITAL_BASED_OUTPATIENT_CLINIC_OR_DEPARTMENT_OTHER)
Admission: RE | Disposition: A | Discharge status: Home / Self Care | Payer: Self-pay | Source: Ambulatory Visit | Attending: Obstetrics and Gynecology

## 2018-01-29 ENCOUNTER — Ambulatory Visit (HOSPITAL_BASED_OUTPATIENT_CLINIC_OR_DEPARTMENT_OTHER)
Admission: RE | Admit: 2018-01-29 | Discharge: 2018-01-29 | Disposition: A | Discharge status: Home / Self Care | Payer: Medicare Other | Source: Ambulatory Visit | Attending: Obstetrics and Gynecology | Admitting: Obstetrics and Gynecology

## 2018-01-29 DIAGNOSIS — N8500 Endometrial hyperplasia, unspecified: Secondary | ICD-10-CM | POA: Diagnosis not present

## 2018-01-29 DIAGNOSIS — N9089 Other specified noninflammatory disorders of vulva and perineum: Secondary | ICD-10-CM | POA: Diagnosis not present

## 2018-01-29 DIAGNOSIS — N950 Postmenopausal bleeding: Secondary | ICD-10-CM | POA: Diagnosis not present

## 2018-01-29 DIAGNOSIS — E119 Type 2 diabetes mellitus without complications: Secondary | ICD-10-CM | POA: Diagnosis not present

## 2018-01-29 DIAGNOSIS — Z7984 Long term (current) use of oral hypoglycemic drugs: Secondary | ICD-10-CM | POA: Insufficient documentation

## 2018-01-29 DIAGNOSIS — Z7982 Long term (current) use of aspirin: Secondary | ICD-10-CM | POA: Insufficient documentation

## 2018-01-29 DIAGNOSIS — Z79899 Other long term (current) drug therapy: Secondary | ICD-10-CM | POA: Insufficient documentation

## 2018-01-29 DIAGNOSIS — N904 Leukoplakia of vulva: Secondary | ICD-10-CM | POA: Diagnosis not present

## 2018-01-29 DIAGNOSIS — C541 Malignant neoplasm of endometrium: Secondary | ICD-10-CM | POA: Insufficient documentation

## 2018-01-29 DIAGNOSIS — I10 Essential (primary) hypertension: Secondary | ICD-10-CM | POA: Insufficient documentation

## 2018-01-29 DIAGNOSIS — N909 Noninflammatory disorder of vulva and perineum, unspecified: Secondary | ICD-10-CM | POA: Diagnosis not present

## 2018-01-29 DIAGNOSIS — K219 Gastro-esophageal reflux disease without esophagitis: Secondary | ICD-10-CM | POA: Diagnosis not present

## 2018-01-29 DIAGNOSIS — L409 Psoriasis, unspecified: Secondary | ICD-10-CM

## 2018-01-29 HISTORY — PX: VULVA /PERINEUM BIOPSY: SHX319

## 2018-01-29 HISTORY — PX: DILATATION & CURRETTAGE/HYSTEROSCOPY WITH RESECTOCOPE: SHX5572

## 2018-01-29 LAB — GLUCOSE, CAPILLARY
GLUCOSE-CAPILLARY: 83 mg/dL (ref 65–99)
Glucose-Capillary: 103 mg/dL — ABNORMAL HIGH (ref 65–99)

## 2018-01-29 SURGERY — DILATATION & CURETTAGE/HYSTEROSCOPY WITH RESECTOCOPE
Anesthesia: General | Site: Vagina

## 2018-01-29 MED ORDER — MEPERIDINE HCL 25 MG/ML IJ SOLN
6.2500 mg | INTRAMUSCULAR | Status: DC | PRN
  Filled 2018-01-29: qty 1

## 2018-01-29 MED ORDER — MIDAZOLAM HCL 2 MG/2ML IJ SOLN
INTRAMUSCULAR | Status: AC
  Filled 2018-01-29: qty 2

## 2018-01-29 MED ORDER — KETOROLAC TROMETHAMINE 30 MG/ML IJ SOLN
INTRAMUSCULAR | Status: AC
  Filled 2018-01-29: qty 2

## 2018-01-29 MED ORDER — SODIUM CHLORIDE 0.9 % IR SOLN
Status: DC | PRN
  Administered 2018-01-29: 3000 mL

## 2018-01-29 MED ORDER — LACTATED RINGERS IV SOLN
INTRAVENOUS | Status: DC
  Administered 2018-01-29 (×3): via INTRAVENOUS
  Filled 2018-01-29: qty 1000

## 2018-01-29 MED ORDER — ONDANSETRON HCL 4 MG/2ML IJ SOLN
4.0000 mg | Freq: Once | INTRAMUSCULAR | Status: DC | PRN
  Filled 2018-01-29: qty 2

## 2018-01-29 MED ORDER — PROPOFOL 10 MG/ML IV BOLUS
INTRAVENOUS | Status: AC
  Filled 2018-01-29: qty 20

## 2018-01-29 MED ORDER — LIDOCAINE 2% (20 MG/ML) 5 ML SYRINGE
INTRAMUSCULAR | Status: AC
  Filled 2018-01-29: qty 5

## 2018-01-29 MED ORDER — DEXAMETHASONE SODIUM PHOSPHATE 4 MG/ML IJ SOLN
INTRAMUSCULAR | Status: DC | PRN
  Administered 2018-01-29: 4 mg via INTRAVENOUS

## 2018-01-29 MED ORDER — IBUPROFEN 600 MG PO TABS: ORAL_TABLET | ORAL | 0 refills | Status: DC

## 2018-01-29 MED ORDER — HYDROMORPHONE HCL 1 MG/ML IJ SOLN
0.2500 mg | INTRAMUSCULAR | Status: DC | PRN
  Filled 2018-01-29: qty 0.5

## 2018-01-29 MED ORDER — PROPOFOL 10 MG/ML IV BOLUS
INTRAVENOUS | Status: DC | PRN
  Administered 2018-01-29: 120 mg via INTRAVENOUS
  Administered 2018-01-29: 40 mg via INTRAVENOUS

## 2018-01-29 MED ORDER — DEXAMETHASONE SODIUM PHOSPHATE 10 MG/ML IJ SOLN
INTRAMUSCULAR | Status: AC
  Filled 2018-01-29: qty 1

## 2018-01-29 MED ORDER — ONDANSETRON HCL 4 MG/2ML IJ SOLN
INTRAMUSCULAR | Status: DC | PRN
  Administered 2018-01-29: 4 mg via INTRAVENOUS

## 2018-01-29 MED ORDER — ONDANSETRON HCL 4 MG/2ML IJ SOLN
INTRAMUSCULAR | Status: AC
  Filled 2018-01-29: qty 2

## 2018-01-29 MED ORDER — MIDAZOLAM HCL 2 MG/2ML IJ SOLN
INTRAMUSCULAR | Status: DC | PRN
  Administered 2018-01-29: 1 mg via INTRAVENOUS

## 2018-01-29 MED ORDER — FENTANYL CITRATE (PF) 100 MCG/2ML IJ SOLN
INTRAMUSCULAR | Status: DC | PRN
  Administered 2018-01-29: 50 ug via INTRAVENOUS

## 2018-01-29 MED ORDER — LIDOCAINE HCL 2 % IJ SOLN
INTRAMUSCULAR | Status: DC | PRN
  Administered 2018-01-29: 11 mL

## 2018-01-29 MED ORDER — FENTANYL CITRATE (PF) 250 MCG/5ML IJ SOLN
INTRAMUSCULAR | Status: AC
  Filled 2018-01-29: qty 5

## 2018-01-29 MED ORDER — EPHEDRINE SULFATE 50 MG/ML IJ SOLN
INTRAMUSCULAR | Status: DC | PRN
  Administered 2018-01-29 (×3): 5 mg via INTRAVENOUS

## 2018-01-29 MED ORDER — KETOROLAC TROMETHAMINE 30 MG/ML IJ SOLN
INTRAMUSCULAR | Status: DC | PRN
  Administered 2018-01-29: 15 mg via INTRAVENOUS

## 2018-01-29 MED ORDER — EPHEDRINE 5 MG/ML INJ
INTRAVENOUS | Status: AC
  Filled 2018-01-29: qty 10

## 2018-01-29 SURGICAL SUPPLY — 39 items
BIPOLAR CUTTING LOOP 21FR (ELECTRODE) ×2
BLADE SURG 15 STRL LF DISP TIS (BLADE) ×1 IMPLANT
BLADE SURG 15 STRL SS (BLADE) ×2
CANISTER SUCT 3000ML PPV (MISCELLANEOUS) ×3 IMPLANT
CATH ROBINSON RED A/P 16FR (CATHETERS) ×3 IMPLANT
CLOTH BEACON ORANGE TIMEOUT ST (SAFETY) ×3 IMPLANT
COUNTER NEEDLE 1200 MAGNETIC (NEEDLE) ×3 IMPLANT
CURETTE PIPELLE ENDOMTRL SUCTN (MISCELLANEOUS) IMPLANT
DILATOR CANAL MILEX (MISCELLANEOUS) IMPLANT
ELECT REM PT RETURN 9FT ADLT (ELECTROSURGICAL)
ELECTRODE REM PT RTRN 9FT ADLT (ELECTROSURGICAL) IMPLANT
GLOVE BIO SURGEON STRL SZ 6.5 (GLOVE) ×2 IMPLANT
GLOVE BIO SURGEONS STRL SZ 6.5 (GLOVE) ×1
GLOVE BIOGEL PI IND STRL 6.5 (GLOVE) ×1 IMPLANT
GLOVE BIOGEL PI IND STRL 7.0 (GLOVE) ×1 IMPLANT
GLOVE BIOGEL PI IND STRL 7.5 (GLOVE) ×1 IMPLANT
GLOVE BIOGEL PI INDICATOR 6.5 (GLOVE) ×2
GLOVE BIOGEL PI INDICATOR 7.0 (GLOVE) ×2
GLOVE BIOGEL PI INDICATOR 7.5 (GLOVE) ×2
GLOVE SURG SS PI 6.5 STRL IVOR (GLOVE) ×6 IMPLANT
GOWN STRL REUS W/TWL LRG LVL3 (GOWN DISPOSABLE) ×6 IMPLANT
KIT TURNOVER CYSTO (KITS) ×3 IMPLANT
LOOP CUTTING BIPOLAR 21FR (ELECTRODE) ×1 IMPLANT
NEEDLE HYPO 22GX1.5 SAFETY (NEEDLE) IMPLANT
PACK PERINEAL COLD (PAD) ×3 IMPLANT
PACK VAGINAL MINOR WOMEN LF (CUSTOM PROCEDURE TRAY) ×3 IMPLANT
PAD OB MATERNITY 4.3X12.25 (PERSONAL CARE ITEMS) ×3 IMPLANT
PAD PREP 24X48 CUFFED NSTRL (MISCELLANEOUS) ×3 IMPLANT
PENCIL BUTTON HOLSTER BLD 10FT (ELECTRODE) IMPLANT
PIPELLE ENDOMETRIAL SUCTION CU (MISCELLANEOUS)
SUT VICRYL 4-0 PS2 18IN ABS (SUTURE) ×3 IMPLANT
SYR CONTROL 10ML LL (SYRINGE) ×3 IMPLANT
TOWEL OR 17X24 6PK STRL BLUE (TOWEL DISPOSABLE) ×6 IMPLANT
TUBE CONNECTING 12'X1/4 (SUCTIONS)
TUBE CONNECTING 12X1/4 (SUCTIONS) IMPLANT
TUBING AQUILEX INFLOW (TUBING) ×3 IMPLANT
TUBING AQUILEX OUTFLOW (TUBING) ×3 IMPLANT
WATER STERILE IRR 500ML POUR (IV SOLUTION) ×3 IMPLANT
YANKAUER SUCT BULB TIP NO VENT (SUCTIONS) IMPLANT

## 2018-01-29 NOTE — Op Note (Signed)
01/29/2018  1:18 PM  PATIENT:  Kiara Kelley  67 y.o. female  PRE-OPERATIVE DIAGNOSIS:  Post-menopausal Bleeding, Vulvar Lesions  POST-OPERATIVE DIAGNOSIS:  Post-menopausal Bleeding, Vulvar Lesions  PROCEDURE:  Procedure(s): DILATATION & CURETTAGE/HYSTEROSCOPY WITH RESECTOCOPE VULVAR BIOPSY  SURGEON:  Eldred Manges, MD  ASSISTANTS: none  ANESTHESIA:   general  ESTIMATED BLOOD LOSS: 10 mL   BLOOD ADMINISTERED:none  COMPLICATIONS:none  FINDINGS: The vulva contained hyperpigmented lesions bilaterally.  On the left side the extended from approximately the 2:00 to 5:00 on the right side there was a single lesion at approximately the 7 o'clock position.  All of the lesions were macular. The uterus sounded to 9 cm.  At hysteroscopy the endometrium appeared disorganized with mounds of tissue and some areas and atrophy in other areas.  No clear lesion consistent with fibroid or polyp could be discerned.  Large amount of tissue was obtained at the time of curettage.  FLUID DEFICIT: 170 cc  LOCAL MEDICATIONS USED:  LIDOCAINE  and Amount: 11 ml  SPECIMEN:  Source of Specimen:  1) endometrial curettings 2) left vulvar biopsy  DISPOSITION OF SPECIMEN:  PATHOLOGY  COUNTS:  YES  DESCRIPTION OF PROCEDURE:the patient was taken to the operating room after appropriate identification and placed on the operating table. After the attainment of adequate general anesthesia she was placed in the lithotomy position. The perineum and vagina were prepped with multiple layers of Betadine. The bladder was emptied with a an in and out catheter. The perineum was draped in sterile field. A gray speculum was placed in the vagina. The cervix was grasped with a single-tooth tenaculum. A paracervical block was achieved with a total of 10 cc of 2% Xylocaine and the 5 and 7:00 positions. The uterus was sounded to 9 cm.  The cervix was then dilated to accommodate the diagnostic hysteroscope. The hysteroscope was  used to evaluate all quadrants of the uterus with the above-noted findings. The large amounts of tissue made the diagnostic hysteroscope difficult to orient and the equipment was changed to the operative hysteroscope.  It was at that time that it became clear that there were disorganized groups of tissue within the endometrial cavity.  It was removed and the Randall stone forceps used to remove tissue.  A small curette was then used to curette all quadrants of the uterus.  The Graves speculum and hysteroscope were removed and the area of the left vulva with pigmented tissue was prepped again with Betadine and infiltrated with 1 cc of 2% Xylocaine.  A 3 mm punch biopsy was used to remove a sample of tissue.  It was removed from the operative field.  The punch defect was then closed with figure-of-eight sutures of 4-0 Vicryl.  Stasis was noted to be adequate. The patient was awakened from general anesthesia and taken to the recovery room in satisfactory condition having tolerated the procedure well with sponge and instrument counts correct.  PLAN OF CARE: Discharge home after postanesthesia care  PATIENT DISPOSITION:  PACU - hemodynamically stable.   Delay start of Pharmacological VTE agent (>24hrs) due to surgical blood loss or risk of bleeding:  yes SCD hose were used through the in the entire procedure.   Eldred Manges, MD 1:18 PM

## 2018-01-29 NOTE — Anesthesia Procedure Notes (Signed)
Procedure Name: LMA Insertion Date/Time: 01/29/2018 12:24 PM Performed by: Georgeanne Nim, CRNA Pre-anesthesia Checklist: Patient identified, Emergency Drugs available, Suction available, Patient being monitored and Timeout performed Patient Re-evaluated:Patient Re-evaluated prior to induction Oxygen Delivery Method: Circle system utilized Preoxygenation: Pre-oxygenation with 100% oxygen Induction Type: IV induction Ventilation: Mask ventilation without difficulty LMA: LMA inserted LMA Size: 4.0 Number of attempts: 1 Placement Confirmation: positive ETCO2,  CO2 detector and breath sounds checked- equal and bilateral Tube secured with: Tape Dental Injury: Teeth and Oropharynx as per pre-operative assessment

## 2018-01-29 NOTE — Discharge Instructions (Signed)
Hysteroscopy, Care After °Refer to this sheet in the next few weeks. These instructions provide you with information on caring for yourself after your procedure. Your health care provider may also give you more specific instructions. Your treatment has been planned according to current medical practices, but problems sometimes occur. Call your health care provider if you have any problems or questions after your procedure. °What can I expect after the procedure? °After your procedure, it is typical to have the following: °· You may have some cramping. This normally lasts for a couple days. °· You may have bleeding. This can vary from light spotting for a few days to menstrual-like bleeding for 3-7 days. ° °Follow these instructions at home: °· Rest for the first 1-2 days after the procedure. °· Only take over-the-counter or prescription medicines as directed by your health care provider. Do not take aspirin. It can increase the chances of bleeding. °· Take showers instead of baths for 2 weeks or as directed by your health care provider. °· Do not drive for 24 hours or as directed. °· Do not drink alcohol while taking pain medicine. °· Do not use tampons, douche, or have sexual intercourse for 2 weeks or until your health care provider says it is okay. °· Take your temperature twice a day for 4-5 days. Write it down each time. °· Follow your health care provider's advice about diet, exercise, and lifting. °· If you develop constipation, you may: °? Take a mild laxative if your health care provider approves. °? Add bran foods to your diet. °? Drink enough fluids to keep your urine clear or pale yellow. °· Try to have someone with you or available to you for the first 24-48 hours, especially if you were given a general anesthetic. °· Follow up with your health care provider as directed. °Contact a health care provider if: °· You feel dizzy or lightheaded. °· You feel sick to your stomach (nauseous). °· You have  abnormal vaginal discharge. °· You have a rash. °· You have pain that is not controlled with medicine. °Get help right away if: °· You have bleeding that is heavier than a normal menstrual period. °· You have a fever. °· You have increasing cramps or pain, not controlled with medicine. °· You have new belly (abdominal) pain. °· You pass out. °· You have pain in the tops of your shoulders (shoulder strap areas). °· You have shortness of breath. °This information is not intended to replace advice given to you by your health care provider. Make sure you discuss any questions you have with your health care provider. °Document Released: 08/18/2013 Document Revised: 04/04/2016 Document Reviewed: 05/27/2013 °Elsevier Interactive Patient Education © 2017 [employer] ° ° °Post Anesthesia Home Care Instructions ° °Activity: °Get plenty of rest for the remainder of the day. A responsible individual must stay with you for 24 hours following the procedure.  °For the next 24 hours, DO NOT: °-Drive a car °-Operate machinery °-Drink alcoholic beverages °-Take any medication unless instructed by your physician °-Make any legal decisions or sign important papers. ° °Meals: °Start with liquid foods such as gelatin or soup. Progress to regular foods as tolerated. Avoid greasy, spicy, heavy foods. If nausea and/or vomiting occur, drink only clear liquids until the nausea and/or vomiting subsides. Call your physician if vomiting continues. ° °Special Instructions/Symptoms: °Your throat may feel dry or sore from the anesthesia or the breathing tube placed in your throat during surgery. If this causes discomfort, gargle   with warm salt water. The discomfort should disappear within 24 hours. ° °

## 2018-01-29 NOTE — Transfer of Care (Signed)
Immediate Anesthesia Transfer of Care Note  Patient: Kiara Kelley  Procedure(s) Performed: DILATATION & CURETTAGE/HYSTEROSCOPY WITH RESECTOCOPE (N/A Vagina ) VULVAR BIOPSY (N/A Vagina )  Patient Location: PACU  Anesthesia Type:General  Level of Consciousness: awake and patient cooperative  Airway & Oxygen Therapy: Patient Spontanous Breathing and Patient connected to nasal cannula oxygen  Post-op Assessment: Report given to RN and Post -op Vital signs reviewed and stable  Post vital signs: Reviewed and stable  Last Vitals:  Vitals Value Taken Time  BP 134/63 01/29/2018  1:23 PM  Temp    Pulse 82 01/29/2018  1:24 PM  Resp 50 01/29/2018  1:24 PM  SpO2 100 % 01/29/2018  1:24 PM  Vitals shown include unvalidated device data.  Last Pain:  Vitals:   01/29/18 0920  TempSrc:   PainSc: 0-No pain      Patients Stated Pain Goal: 0 (01/29/21 4825)  Complications: No apparent anesthesia complications

## 2018-01-29 NOTE — Anesthesia Postprocedure Evaluation (Signed)
Anesthesia Post Note  Patient: Kiara Kelley  Procedure(s) Performed: DILATATION & CURETTAGE/HYSTEROSCOPY WITH RESECTOCOPE (N/A Vagina ) VULVAR BIOPSY (N/A Vagina )     Patient location during evaluation: PACU Anesthesia Type: General Level of consciousness: awake and alert Pain management: pain level controlled Vital Signs Assessment: post-procedure vital signs reviewed and stable Respiratory status: spontaneous breathing, nonlabored ventilation, respiratory function stable and patient connected to nasal cannula oxygen Cardiovascular status: blood pressure returned to baseline and stable Postop Assessment: no apparent nausea or vomiting Anesthetic complications: no    Last Vitals:  Vitals Value Taken Time  BP 152/66 01/29/2018  2:12 PM  Temp    Pulse 83 01/29/2018  2:13 PM  Resp 22 01/29/2018  2:13 PM  SpO2 91 % 01/29/2018  2:13 PM  Vitals shown include unvalidated device data.  Last Pain:  Vitals:   01/29/18 1400  TempSrc:   PainSc: 0-No pain                 Lolita Faulds DAVID

## 2018-01-29 NOTE — Anesthesia Preprocedure Evaluation (Signed)
Anesthesia Evaluation  Patient identified by MRN, date of birth, ID band Patient awake    Reviewed: Allergy & Precautions, NPO status , Patient's Chart, lab work & pertinent test results  Airway Mallampati: I  TM Distance: >3 FB Neck ROM: Full    Dental   Pulmonary    Pulmonary exam normal        Cardiovascular hypertension, Pt. on medications Normal cardiovascular exam     Neuro/Psych    GI/Hepatic GERD  Medicated and Controlled,  Endo/Other  diabetes, Type 2, Oral Hypoglycemic Agents  Renal/GU      Musculoskeletal   Abdominal   Peds  Hematology   Anesthesia Other Findings   Reproductive/Obstetrics                             Anesthesia Physical Anesthesia Plan  ASA: II  Anesthesia Plan: General   Post-op Pain Management:    Induction: Intravenous  PONV Risk Score and Plan: 3 and Ondansetron, Midazolam and Treatment may vary due to age or medical condition  Airway Management Planned: LMA  Additional Equipment:   Intra-op Plan:   Post-operative Plan: Extubation in OR  Informed Consent: I have reviewed the patients History and Physical, chart, labs and discussed the procedure including the risks, benefits and alternatives for the proposed anesthesia with the patient or authorized representative who has indicated his/her understanding and acceptance.     Plan Discussed with: CRNA and Surgeon  Anesthesia Plan Comments:         Anesthesia Quick Evaluation

## 2018-01-30 ENCOUNTER — Encounter (HOSPITAL_BASED_OUTPATIENT_CLINIC_OR_DEPARTMENT_OTHER): Payer: Self-pay | Admitting: Obstetrics and Gynecology

## 2018-02-04 ENCOUNTER — Telehealth: Payer: Self-pay | Admitting: *Deleted

## 2018-02-04 NOTE — Telephone Encounter (Signed)
Dr. Caralee Ates office called and schedueld an appt for April 10th. They will notify the patient tomorrow

## 2018-02-05 ENCOUNTER — Telehealth: Payer: Self-pay

## 2018-02-05 DIAGNOSIS — C541 Malignant neoplasm of endometrium: Secondary | ICD-10-CM | POA: Diagnosis not present

## 2018-02-05 DIAGNOSIS — N950 Postmenopausal bleeding: Secondary | ICD-10-CM | POA: Diagnosis not present

## 2018-02-05 DIAGNOSIS — N904 Leukoplakia of vulva: Secondary | ICD-10-CM | POA: Diagnosis not present

## 2018-02-05 NOTE — Telephone Encounter (Signed)
Received VM from Dr Leo Grosser regarding our office can contact the patient now, as Dr Leo Grosser has spoke with the patient about her diagnosis and is ready to hear from our office now for an appt.  Her appt is 02-18-2018 with Dr Denman George.  Attempted to notify pt of appt date/time via phone at this time, no answer, left VM with our contact info.

## 2018-02-06 ENCOUNTER — Telehealth: Payer: Self-pay | Admitting: *Deleted

## 2018-02-06 NOTE — Telephone Encounter (Signed)
Patient called back and was given the appt for April 10th

## 2018-02-18 ENCOUNTER — Inpatient Hospital Stay: Payer: Medicare Other | Attending: Gynecologic Oncology | Admitting: Gynecologic Oncology

## 2018-02-18 ENCOUNTER — Encounter: Payer: Self-pay | Admitting: Gynecologic Oncology

## 2018-02-18 VITALS — BP 147/64 | HR 82 | Temp 98.0°F | Resp 18 | Ht 62.75 in | Wt 146.2 lb

## 2018-02-18 DIAGNOSIS — C541 Malignant neoplasm of endometrium: Secondary | ICD-10-CM | POA: Insufficient documentation

## 2018-02-18 DIAGNOSIS — E119 Type 2 diabetes mellitus without complications: Secondary | ICD-10-CM | POA: Insufficient documentation

## 2018-02-18 NOTE — Patient Instructions (Addendum)
Preparing for your Surgery  Plan for surgery on Mar 23, 2018 with Dr. Everitt Amber at [facility] will be scheduled for a robotic assisted total hysterectomy, bilateral salpingo-oophorectomy, sentinel lymph node biopsy.  Pre-operative Testing -You will receive a phone call from presurgical testing at [facility] to arrange for a pre-operative testing appointment before your surgery.  This appointment normally occurs one to two weeks before your scheduled surgery.   -Bring your insurance card, copy of an advanced directive if applicable, medication list  -At that visit, you will be asked to sign a consent for a possible blood transfusion in case a transfusion becomes necessary during surgery.  The need for a blood transfusion is rare but having consent is a necessary part of your care.     -STOP TAKING ASPIRIN NOW.  Day Before Surgery at [facility] will be asked to take in a light diet the day before surgery.  Avoid carbonated beverages.  You will be advised to have nothing to eat or drink after midnight the evening before.    Eat a light diet the day before surgery.  Examples including soups, broths, toast, yogurt, mashed potatoes.  Things to avoid include carbonated beverages (fizzy beverages), raw fruits and raw vegetables, or beans.   If your bowels are filled with gas, your surgeon will have difficulty visualizing your pelvic organs which increases your surgical risks.  Your role in recovery Your role is to become active as soon as directed by your doctor, while still giving yourself time to heal.  Rest when you feel tired. You will be asked to do the following in order to speed your recovery:  - Cough and breathe deeply. This helps toclear and expand your lungs and can prevent pneumonia. You may be given a spirometer to practice deep breathing. A staff member will show you how to use the spirometer. - Do mild physical activity. Walking or moving your  legs help your circulation and body functions return to normal. A staff member will help you when you try to walk and will provide you with simple exercises. Do not try to get up or walk alone the first time. - Actively manage your pain. Managing your pain lets you move in comfort. We will ask you to rate your pain on a scale of zero to 10. It is your responsibility to tell your doctor or nurse where and how much you hurt so your pain can be treated.  Special Considerations -If you are diabetic, you may be placed on insulin after surgery to have closer control over your blood sugars to promote healing and recovery.  This does not mean that you will be discharged on insulin.  If applicable, your oral antidiabetics will be resumed when you are tolerating a solid diet.  -Your final pathology results from surgery should be available by the Friday after surgery and the results will be relayed to you when available.  -Dr. Lahoma Crocker is the Surgeon that assists your GYN Oncologist with surgery.  The next day after your surgery you will either see your GYN Oncologist or Dr. Lahoma Crocker.   Blood Transfusion Information WHAT IS A BLOOD TRANSFUSION? A transfusion is the replacement of blood or some of its parts. Blood is made up of multiple cells which provide different functions.  Red blood cells carry oxygen and are used for blood loss replacement.  White blood cells fight against infection.  Platelets control bleeding.  Plasma helps clot  blood.  Other blood products are available for specialized needs, such as hemophilia or other clotting disorders. BEFORE THE TRANSFUSION  Who gives blood for transfusions?   You may be able to donate blood to be used at a later date on yourself (autologous donation).  Relatives can be asked to donate blood. This is generally not any safer than if you have received blood from a stranger. The same precautions are taken to ensure safety when a  relative's blood is donated.  Healthy volunteers who are fully evaluated to make sure their blood is safe. This is blood bank blood. Transfusion therapy is the safest it has ever been in the practice of medicine. Before blood is taken from a donor, a complete history is taken to make sure that person has no history of diseases nor engages in risky social behavior (examples are intravenous drug use or sexual activity with multiple partners). The donor's travel history is screened to minimize risk of transmitting infections, such as malaria. The donated blood is tested for signs of infectious diseases, such as HIV and hepatitis. The blood is then tested to be sure it is compatible with you in order to minimize the chance of a transfusion reaction. If you or a relative donates blood, this is often done in anticipation of surgery and is not appropriate for emergency situations. It takes many days to process the donated blood. RISKS AND COMPLICATIONS Although transfusion therapy is very safe and saves many lives, the main dangers of transfusion include:   Getting an infectious disease.  Developing a transfusion reaction. This is an allergic reaction to something in the blood you were given. Every precaution is taken to prevent this. The decision to have a blood transfusion has been considered carefully by your caregiver before blood is given. Blood is not given unless the benefits outweigh the risks.

## 2018-02-18 NOTE — Progress Notes (Signed)
Consult Note: Gyn-Onc  Consult was requested by Dr. Leo Grosser for the evaluation of Kiara Kelley 67 y.o. female  CC:  Chief Complaint  Patient presents with  . Endometrial cancer [facility])    Assessment/Plan:  Kiara Kelley  is a 67 y.o.  year old with grade 1 endometrioid endometrial adenocarcinoma.  A detailed discussion was held with the patient and her family with regard to to her endometrial cancer diagnosis. We discussed the standard management options for uterine cancer which includes surgery followed possibly by adjuvant therapy depending on the results of surgery. The options for surgical management include a hysterectomy and removal of the tubes and ovaries possibly with removal of pelvic and para-aortic lymph nodes.If feasible, a minimally invasive approach including a robotic hysterectomy or laparoscopic hysterectomy have benefits including shorter hospital stay, recovery time and better wound healing than with open surgery. The patient has been counseled about these surgical options and the risks of surgery in general including infection, bleeding, damage to surrounding structures (including bowel, bladder, ureters, nerves or vessels), and the postoperative risks of PE/ DVT, and lymphedema. I extensively reviewed the additional risks of robotic hysterectomy including possible need for conversion to open laparotomy.  I discussed positioning during surgery of trendelenberg and risks of minor facial swelling and care we take in preoperative positioning.  After counseling and consideration of her options, she desires to proceed with robotic assisted total hysterectomy with bilateral sapingo-oophorectomy and SLN biopsy.   She will be seen by anesthesia for preoperative clearance and discussion of postoperative pain management.  She was given the opportunity to ask questions, which were answered to her satisfaction, and she is agreement with the above mentioned plan of care.  She will hold  her fishoil preoperatively to reduce bleeding risk.   HPI: Kiara Kelley is a very pleasant 67 year old P3 who is seen in consultation at the request of Dr Leo Grosser for grade 1 endometrial cancer.  Patient reports having an episode of vaginal bleeding in mid March 2019.  She was immediately seen by her gynecologist, Dr. Biagio Quint, who promptly performed a transvaginal ultrasound scan which revealed a slightly enlarged uterus measuring approximately 10 cm with some intramural fibroids seen the largest of which was 3.8 cm.  The endometrial thickness was 11.6.  She was then scheduled for a hysteroscopy D&C and vulvar biopsy of pigmented vulvar lesions which took place on January 29, 2018.  The vulvar biopsy revealed lichen sclerosis however the endometrial curettage revealed a FIGO grade 1 endometrioid adenocarcinoma.  Kiara Kelley has a history of type 2 diabetes from young age.  She takes metformin for this and glimepiride.  She has had no prior abdominal surgeries.  Her family history is significant only for a father with lung cancer.  Diabetes runs strongly in her family.  Current Meds:  Outpatient Encounter Medications as of 02/18/2018  Medication Sig  . aspirin 81 MG tablet Take 81 mg by mouth every evening. 2 tabs  . colesevelam (WELCHOL) 625 MG tablet Take 1,875 mg by mouth every evening.   Marland Kitchen glimepiride (AMARYL) 4 MG tablet Take 4 mg by mouth daily with breakfast.  . ibuprofen (ADVIL,MOTRIN) 600 MG tablet Take 600mg  by mouth every 6 hours for 3 days, then every 6 hours as needed for pain  . lisinopril (PRINIVIL,ZESTRIL) 2.5 MG tablet Take 2.5 mg by mouth daily.  . metFORMIN (GLUCOPHAGE) 1000 MG tablet Take 1,000 mg by mouth 2 (two) times daily with a meal.  .  Omega-3 Fatty Acids (FISH OIL) 1200 MG CAPS Take by mouth. 1 in pm  . simvastatin (ZOCOR) 40 MG tablet Take 40 mg by mouth daily at 6 PM.  . sitaGLIPtin (JANUVIA) 100 MG tablet Take 100 mg by mouth every evening. Takes 1/2 pill in pm  . UNABLE TO  FIND Vitamin d 3 2000 iu pm   No facility-administered encounter medications on file as of 02/18/2018.     Allergy: No Known Allergies  Social Hx:   Social History   Socioeconomic History  . Marital status: Married    Spouse name: Not on file  . Number of children: Not on file  . Years of education: Not on file  . Highest education level: Not on file  Occupational History  . Not on file  Social Needs  . Financial resource strain: Not on file  . Food insecurity:    Worry: Not on file    Inability: Not on file  . Transportation needs:    Medical: Not on file    Non-medical: Not on file  Tobacco Use  . Smoking status: Never Smoker  . Smokeless tobacco: Never Used  Substance and Sexual Activity  . Alcohol use: Yes    Comment: social only  . Drug use: No  . Sexual activity: Yes  Lifestyle  . Physical activity:    Days per week: Not on file    Minutes per session: Not on file  . Stress: Not on file  Relationships  . Social connections:    Talks on phone: Not on file    Gets together: Not on file    Attends religious service: Not on file    Active member of club or organization: Not on file    Attends meetings of clubs or organizations: Not on file    Relationship status: Not on file  . Intimate partner violence:    Fear of current or ex partner: Not on file    Emotionally abused: Not on file    Physically abused: Not on file    Forced sexual activity: Not on file  Other Topics Concern  . 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 COLONOSCOPY WITH PROPOFOL N/A 09/14/2013   Procedure: COLONOSCOPY WITH PROPOFOL;  Surgeon: Garlan Fair, MD;  Location: [facility];  Service: Endoscopy;  Laterality: N/A;  . DILATATION & CURRETTAGE/HYSTEROSCOPY WITH RESECTOCOPE N/A 01/29/2018   Procedure: DILATATION & CURETTAGE/HYSTEROSCOPY WITH RESECTOCOPE;  Surgeon:  Eldred Manges, MD;  Location: [place];  Service: Gynecology;  Laterality: N/A;  . EYE SURGERY Bilateral 2011   cataract extraction  . VULVA /PERINEUM BIOPSY N/A 01/29/2018   Procedure: VULVAR BIOPSY;  Surgeon: Eldred Manges, MD;  Location: [facility];  Service: Gynecology;  Laterality: N/A;  . WISDOM TOOTH EXTRACTION      Past Medical Hx:  Past Medical History:  Diagnosis Date  . Complication of anesthesia   . Diabetes mellitus without complication ([place])    type 2  . GERD (gastroesophageal reflux disease)   . Hypertension     Past Gynecological History:  SVD x 3 No LMP recorded. Patient is postmenopausal.  Family Hx:  Family History  Problem Relation Age of Onset  . Heart disease Mother   . Lung cancer Father   . Diabetes Brother     Review of Systems:  Constitutional  Feels well,    ENT Normal appearing ears and nares bilaterally Skin/Breast  No rash, sores, jaundice, itching, dryness Cardiovascular  No chest pain, shortness of breath, or edema  Pulmonary  No cough or wheeze.  Gastro Intestinal  No nausea, vomitting, or diarrhoea. No bright red blood per rectum, no abdominal pain, change in bowel movement, or constipation.  Genito Urinary  No frequency, urgency, dysuria, + postmenopausal bleeding Musculo Skeletal  No myalgia, arthralgia, joint swelling or pain  Neurologic  No weakness, numbness, change in gait,  Psychology  No depression, anxiety, insomnia.   Vitals:  Blood pressure (!) 147/64, pulse 82, temperature 98 F (36.7 C), temperature source Oral, resp. rate 18, height 5' 2.75" (1.594 m), weight 146 lb 3.2 oz (66.3 kg), SpO2 97 %.  Physical Exam: WD in NAD Neck  Supple NROM, without any enlargements.  Lymph Node Survey No cervical supraclavicular or inguinal adenopathy Cardiovascular  Pulse normal rate, regularity and rhythm. S1 and S2 normal.  Lungs  Clear to auscultation bilateraly, without  wheezes/crackles/rhonchi. Good air movement.  Skin  No rash/lesions/breakdown  Psychiatry  Alert and oriented to person, place, and time  Abdomen  Normoactive bowel sounds, abdomen soft, non-tender and nonobese without evidence of hernia.  Back No CVA tenderness Genito Urinary  Vulva/vagina: Normal external female genitalia.   No lesions. No discharge or bleeding.  Bladder/urethra:  No lesions or masses, well supported bladder  Vagina: no lesions or masses  Cervix: Normal appearing, no lesions.  Uterus:  Slightly globular and 10cm, mobile, no parametrial involvement or nodularity.  Adnexa: no palpable masses. Rectal  deferred Extremities  No bilateral cyanosis, clubbing or edema.   Thereasa Solo, MD  02/18/2018, 5:48 PM

## 2018-03-05 ENCOUNTER — Telehealth: Payer: Self-pay | Admitting: *Deleted

## 2018-03-05 NOTE — Telephone Encounter (Signed)
Returned the patient call and informed her that the pre-op would call her the week before the surgery

## 2018-03-12 NOTE — Patient Instructions (Addendum)
Kiara Kelley  03/12/2018   Your procedure is scheduled on:  03-23-2018  Report to [facility] Main  Entrance  Report to admitting at   5:30 AM    Call this number if you have problems the morning of surgery [phone]   Remember: Do not eat food or drink liquids :After Midnight.     Take these medicines the morning of surgery with A SIP OF WATER:   None DO NOT TAKE ANY DIABETIC MEDICATIONS DAY OF YOUR SURGERY                               You may not have any metal on your body including hair pins and              piercings               Do not wear jewelry, make-up, lotions, powders or perfumes, deodorant             Do not wear nail polish.  Do not shave  48 hours prior to surgery.                 Do not bring valuables to the hospital. [place].  Contacts, dentures or bridgework may not be worn into surgery.  Leave suitcase in the car. After surgery it may be brought to your room.             Special instructions:  Cough and Deep breathing,  Leg exercises              Please read over the following fact sheets you were given: _____________________________________________________________________  Eat a light diet the day before surgery.  Examples including soups, broths, toast, yogurt, mashed potatoes.  Things to avoid include carbonated beverages (fizzy beverages), raw fruits and raw vegetables, or beans.   If your bowels are filled with gas, your surgeon will have difficulty visualizing your pelvic organs which increases your surgical risks.           [place] - Preparing for Surgery Before surgery, you can play an important role.  Because skin is not sterile, your skin needs to be as free of germs as possible.  You can reduce the number of germs on your skin by washing with CHG (chlorahexidine gluconate) soap before surgery.  CHG is an antiseptic cleaner which kills germs and bonds with the skin  to continue killing germs even after washing. Please DO NOT use if you have an allergy to CHG or antibacterial soaps.  If your skin becomes reddened/irritated stop using the CHG and inform your nurse when you arrive at Short Stay. Do not shave (including legs and underarms) for at least 48 hours prior to the first CHG shower.  You may shave your face/neck. Please follow these instructions carefully:  1.  Shower with CHG Soap the night before surgery and the  morning of Surgery.  2.  If you choose to wash your hair, wash your hair first as usual with your  normal  shampoo.  3.  After you shampoo, rinse your hair and body thoroughly to remove the  shampoo.  4.  Use CHG as you would any other liquid soap.  You can apply chg directly  to the skin and wash                       Gently with a scrungie or clean washcloth.  5.  Apply the CHG Soap to your body ONLY FROM THE NECK DOWN.   Do not use on face/ open                           Wound or open sores. Avoid contact with eyes, ears mouth and genitals (private parts).                       Wash face,  Genitals (private parts) with your normal soap.             6.  Wash thoroughly, paying special attention to the area where your surgery  will be performed.  7.  Thoroughly rinse your body with warm water from the neck down.  8.  DO NOT shower/wash with your normal soap after using and rinsing off  the CHG Soap.                9.  Pat yourself dry with a clean towel.            10.  Wear clean pajamas.            11.  Place clean sheets on your bed the night of your first shower and do not  sleep with pets. Day of Surgery : Do not apply any lotions/deodorants the morning of surgery.  Please wear clean clothes to the hospital/surgery center.  FAILURE TO FOLLOW THESE INSTRUCTIONS MAY RESULT IN THE CANCELLATION OF YOUR SURGERY PATIENT SIGNATURE_________________________________  NURSE  SIGNATURE__________________________________  ________________________________________________________________________   Kiara Kelley  An incentive spirometer is a tool that can help keep your lungs clear and active. This tool measures how well you are filling your lungs with each breath. Taking long deep breaths may help reverse or decrease the chance of developing breathing (pulmonary) problems (especially infection) following:  A long period of time when you are unable to move or be active. BEFORE THE PROCEDURE   If the spirometer includes an indicator to show your best effort, your nurse or respiratory therapist will set it to a desired goal.  If possible, sit up straight or lean slightly forward. Try not to slouch.  Hold the incentive spirometer in an upright position. INSTRUCTIONS FOR USE  1. Sit on the edge of your bed if possible, or sit up as far as you can in bed or on a chair. 2. Hold the incentive spirometer in an upright position. 3. Breathe out normally. 4. Place the mouthpiece in your mouth and seal your lips tightly around it. 5. Breathe in slowly and as deeply as possible, raising the piston or the ball toward the top of the column. 6. Hold your breath for 3-5 seconds or for as long as possible. Allow the piston or ball to fall to the bottom of the column. 7. Remove the mouthpiece from your mouth and breathe out normally. 8. Rest for a few seconds and repeat Steps 1 through 7 at least 10 times every 1-2 hours when you are awake. Take your time and take a few normal breaths between deep breaths. 9. The spirometer may include an indicator to show  your best effort. Use the indicator as a goal to work toward during each repetition. 10. After each set of 10 deep breaths, practice coughing to be sure your lungs are clear. If you have an incision (the cut made at the time of surgery), support your incision when coughing by placing a pillow or rolled up towels firmly  against it. Once you are able to get out of bed, walk around indoors and cough well. You may stop using the incentive spirometer when instructed by your caregiver.  RISKS AND COMPLICATIONS  Take your time so you do not get dizzy or light-headed.  If you are in pain, you may need to take or ask for pain medication before doing incentive spirometry. It is harder to take a deep breath if you are having pain. AFTER USE  Rest and breathe slowly and easily.  It can be helpful to keep track of a log of your progress. Your caregiver can provide you with a simple table to help with this. If you are using the spirometer at home, follow these instructions: [place] IF:   You are having difficultly using the spirometer.  You have trouble using the spirometer as often as instructed.  Your pain medication is not giving enough relief while using the spirometer.  You develop fever of 100.5 F (38.1 C) or higher. SEEK IMMEDIATE MEDICAL CARE IF:   You cough up bloody sputum that had not been present before.  You develop fever of 102 F (38.9 C) or greater.  You develop worsening pain at or near the incision site. MAKE SURE YOU:   Understand these instructions.  Will watch your condition.  Will get help right away if you are not doing well or get worse. Document Released: 03/10/2007 Document Revised: 01/20/2012 Document Reviewed: 05/11/2007 ExitCare Patient Information 2014 ExitCare, [place].   ________________________________________________________________________  WHAT IS A BLOOD TRANSFUSION? Blood Transfusion Information  A transfusion is the replacement of blood or some of its parts. Blood is made up of multiple cells which provide different functions.  Red blood cells carry oxygen and are used for blood loss replacement.  White blood cells fight against infection.  Platelets control bleeding.  Plasma helps clot blood.  Other blood products are available for  specialized needs, such as hemophilia or other clotting disorders. BEFORE THE TRANSFUSION  Who gives blood for transfusions?   Healthy volunteers who are fully evaluated to make sure their blood is safe. This is blood bank blood. Transfusion therapy is the safest it has ever been in the practice of medicine. Before blood is taken from a donor, a complete history is taken to make sure that person has no history of diseases nor engages in risky social behavior (examples are intravenous drug use or sexual activity with multiple partners). The donor's travel history is screened to minimize risk of transmitting infections, such as malaria. The donated blood is tested for signs of infectious diseases, such as HIV and hepatitis. The blood is then tested to be sure it is compatible with you in order to minimize the chance of a transfusion reaction. If you or a relative donates blood, this is often done in anticipation of surgery and is not appropriate for emergency situations. It takes many days to process the donated blood. RISKS AND COMPLICATIONS Although transfusion therapy is very safe and saves many lives, the main dangers of transfusion include:   Getting an infectious disease.  Developing a transfusion reaction. This is an allergic reaction to  something in the blood you were given. Every precaution is taken to prevent this. The decision to have a blood transfusion has been considered carefully by your caregiver before blood is given. Blood is not given unless the benefits outweigh the risks. AFTER THE TRANSFUSION  Right after receiving a blood transfusion, you will usually feel much better and more energetic. This is especially true if your red blood cells have gotten low (anemic). The transfusion raises the level of the red blood cells which carry oxygen, and this usually causes an energy increase.  The nurse administering the transfusion will monitor you carefully for complications. HOME CARE  INSTRUCTIONS  No special instructions are needed after a transfusion. You may find your energy is better. Speak with your caregiver about any limitations on activity for underlying diseases you may have. SEEK MEDICAL CARE IF:   Your condition is not improving after your transfusion.  You develop redness or irritation at the intravenous (IV) site. SEEK IMMEDIATE MEDICAL CARE IF:  Any of the following symptoms occur over the next 12 hours:  Shaking chills.  You have a temperature by mouth above 102 F (38.9 C), not controlled by medicine.  Chest, back, or muscle pain.  People around you feel you are not acting correctly or are confused.  Shortness of breath or difficulty breathing.  Dizziness and fainting.  You get a rash or develop hives.  You have a decrease in urine output.  Your urine turns a dark color or changes to pink, red, or brown. Any of the following symptoms occur over the next 10 days:  You have a temperature by mouth above 102 F (38.9 C), not controlled by medicine.  Shortness of breath.  Weakness after normal activity.  The white part of the eye turns yellow (jaundice).  You have a decrease in the amount of urine or are urinating less often.  Your urine turns a dark color or changes to pink, red, or brown. Document Released: 10/25/2000 Document Revised: 01/20/2012 Document Reviewed: 06/13/2008 [facility] Patient Information 2014 [place], [place].  _______________________________________________________________________

## 2018-03-16 ENCOUNTER — Telehealth: Payer: Self-pay | Admitting: *Deleted

## 2018-03-16 ENCOUNTER — Encounter (HOSPITAL_COMMUNITY)
Admission: RE | Admit: 2018-03-16 | Discharge: 2018-03-16 | Disposition: A | Discharge status: Home / Self Care | Payer: Medicare Other | Source: Ambulatory Visit | Attending: Gynecologic Oncology | Admitting: Gynecologic Oncology

## 2018-03-16 ENCOUNTER — Other Ambulatory Visit: Payer: Self-pay

## 2018-03-16 ENCOUNTER — Encounter (HOSPITAL_COMMUNITY): Payer: Self-pay

## 2018-03-16 DIAGNOSIS — K219 Gastro-esophageal reflux disease without esophagitis: Secondary | ICD-10-CM | POA: Diagnosis not present

## 2018-03-16 DIAGNOSIS — Z79899 Other long term (current) drug therapy: Secondary | ICD-10-CM | POA: Diagnosis not present

## 2018-03-16 DIAGNOSIS — E119 Type 2 diabetes mellitus without complications: Secondary | ICD-10-CM | POA: Diagnosis not present

## 2018-03-16 DIAGNOSIS — Z01812 Encounter for preprocedural laboratory examination: Secondary | ICD-10-CM | POA: Insufficient documentation

## 2018-03-16 DIAGNOSIS — C541 Malignant neoplasm of endometrium: Secondary | ICD-10-CM | POA: Diagnosis not present

## 2018-03-16 DIAGNOSIS — Z8249 Family history of ischemic heart disease and other diseases of the circulatory system: Secondary | ICD-10-CM | POA: Diagnosis not present

## 2018-03-16 DIAGNOSIS — D251 Intramural leiomyoma of uterus: Secondary | ICD-10-CM | POA: Diagnosis not present

## 2018-03-16 DIAGNOSIS — I10 Essential (primary) hypertension: Secondary | ICD-10-CM | POA: Diagnosis not present

## 2018-03-16 DIAGNOSIS — Z7982 Long term (current) use of aspirin: Secondary | ICD-10-CM | POA: Diagnosis not present

## 2018-03-16 DIAGNOSIS — N800 Endometriosis of uterus: Secondary | ICD-10-CM | POA: Diagnosis not present

## 2018-03-16 DIAGNOSIS — Z7984 Long term (current) use of oral hypoglycemic drugs: Secondary | ICD-10-CM | POA: Diagnosis not present

## 2018-03-16 HISTORY — DX: Type 2 diabetes mellitus without complications: E11.9

## 2018-03-16 HISTORY — DX: Other specified postprocedural states: Z98.890

## 2018-03-16 HISTORY — DX: Other specified postprocedural states: R11.2

## 2018-03-16 LAB — URINALYSIS, ROUTINE W REFLEX MICROSCOPIC
Bilirubin Urine: NEGATIVE
Glucose, UA: NEGATIVE mg/dL
KETONES UR: NEGATIVE mg/dL
Nitrite: NEGATIVE
Protein, ur: NEGATIVE mg/dL
Specific Gravity, Urine: 1.01 (ref 1.005–1.030)
pH: 5 (ref 5.0–8.0)

## 2018-03-16 LAB — COMPREHENSIVE METABOLIC PANEL
ALK PHOS: 53 U/L (ref 38–126)
ALT: 17 U/L (ref 14–54)
ANION GAP: 10 (ref 5–15)
AST: 22 U/L (ref 15–41)
Albumin: 4.3 g/dL (ref 3.5–5.0)
BILIRUBIN TOTAL: 0.8 mg/dL (ref 0.3–1.2)
BUN: 17 mg/dL (ref 6–20)
CALCIUM: 9.7 mg/dL (ref 8.9–10.3)
CO2: 25 mmol/L (ref 22–32)
Chloride: 103 mmol/L (ref 101–111)
Creatinine, Ser: 0.54 mg/dL (ref 0.44–1.00)
Glucose, Bld: 75 mg/dL (ref 65–99)
Potassium: 4.3 mmol/L (ref 3.5–5.1)
Sodium: 138 mmol/L (ref 135–145)
TOTAL PROTEIN: 7.9 g/dL (ref 6.5–8.1)

## 2018-03-16 LAB — CBC
HEMATOCRIT: 44 % (ref 36.0–46.0)
HEMOGLOBIN: 14.6 g/dL (ref 12.0–15.0)
MCH: 29.5 pg (ref 26.0–34.0)
MCHC: 33.2 g/dL (ref 30.0–36.0)
MCV: 88.9 fL (ref 78.0–100.0)
Platelets: 258 10*3/uL (ref 150–400)
RBC: 4.95 MIL/uL (ref 3.87–5.11)
RDW: 13.4 % (ref 11.5–15.5)
WBC: 8.1 10*3/uL (ref 4.0–10.5)

## 2018-03-16 LAB — GLUCOSE, CAPILLARY: GLUCOSE-CAPILLARY: 75 mg/dL (ref 65–99)

## 2018-03-16 LAB — ABO/RH: ABO/RH(D): A POS

## 2018-03-16 LAB — HEMOGLOBIN A1C
Hgb A1c MFr Bld: 5.4 % (ref 4.8–5.6)
MEAN PLASMA GLUCOSE: 108.28 mg/dL

## 2018-03-16 NOTE — Progress Notes (Signed)
UA results dated 03-16-2018 sent to dr Denman George in epic.  Current EKG dated 01-23-2018 in epic.

## 2018-03-16 NOTE — Telephone Encounter (Signed)
Called and left the patient a messge that we moved her post op appt per her request. Appt moved to June 12th at 11:30am

## 2018-03-17 ENCOUNTER — Telehealth: Payer: Self-pay

## 2018-03-17 DIAGNOSIS — N390 Urinary tract infection, site not specified: Secondary | ICD-10-CM

## 2018-03-17 MED ORDER — NITROFURANTOIN MONOHYD MACRO 100 MG PO CAPS: 100.0000 mg | ORAL_CAPSULE | Freq: Two times a day (BID) | ORAL | 0 refills | Status: DC

## 2018-03-17 NOTE — Telephone Encounter (Signed)
Told Ms Chesmore that the urine culture done at her pre op testing is indicating that is has a UTI. Melissa Cross,NP will send in Macrobid 100 mg bid x 5 days to her Pharmacy. ATB could change pending the results of the culture sensitivities. Pt with no difficulties with any ATB that she is aware of at this time. Pt verbalized understanding.

## 2018-03-18 LAB — URINE CULTURE

## 2018-03-18 NOTE — Telephone Encounter (Signed)
Organism is sensitive to Macrobid per Joylene John, NP.

## 2018-03-21 ENCOUNTER — Encounter (HOSPITAL_COMMUNITY): Payer: Self-pay | Admitting: Anesthesiology

## 2018-03-21 NOTE — Anesthesia Preprocedure Evaluation (Addendum)
Anesthesia Evaluation  Patient identified by MRN, date of birth, ID band Patient awake    Reviewed: Allergy & Precautions, NPO status , Patient's Chart, lab work & pertinent test results  History of Anesthesia Complications (+) PONV and history of anesthetic complications  Airway Mallampati: II  TM Distance: >3 FB Neck ROM: Full    Dental  (+) Teeth Intact, Caps   Pulmonary neg pulmonary ROS,    Pulmonary exam normal breath sounds clear to auscultation       Cardiovascular hypertension, Pt. on medications Normal cardiovascular exam Rhythm:Regular Rate:Normal     Neuro/Psych negative neurological ROS  negative psych ROS   GI/Hepatic Neg liver ROS, GERD  Medicated and Controlled,  Endo/Other  diabetes, Well Controlled, Type 2, Oral Hypoglycemic AgentsHyperlipidemia  Renal/GU negative Renal ROS  negative genitourinary   Musculoskeletal negative musculoskeletal ROS (+)   Abdominal   Peds  Hematology negative hematology ROS (+)   Anesthesia Other Findings   Reproductive/Obstetrics Endometrial Ca Vulvar lesion                           Anesthesia Physical Anesthesia Plan  ASA: II  Anesthesia Plan: General   Post-op Pain Management:    Induction: Intravenous  PONV Risk Score and Plan: 4 or greater and Scopolamine patch - Pre-op, Diphenhydramine, Midazolam, Dexamethasone, Ondansetron and Treatment may vary due to age or medical condition  Airway Management Planned: Oral ETT  Additional Equipment:   Intra-op Plan:   Post-operative Plan: Extubation in OR  Informed Consent: I have reviewed the patients History and Physical, chart, labs and discussed the procedure including the risks, benefits and alternatives for the proposed anesthesia with the patient or authorized representative who has indicated his/her understanding and acceptance.   Dental advisory given  Plan Discussed with:  CRNA and Surgeon  Anesthesia Plan Comments:        Anesthesia Quick Evaluation

## 2018-03-23 ENCOUNTER — Ambulatory Visit (HOSPITAL_COMMUNITY)
Admission: RE | Admit: 2018-03-23 | Discharge: 2018-03-23 | Disposition: A | Discharge status: Home / Self Care | Payer: Medicare Other | Source: Ambulatory Visit | Attending: Gynecologic Oncology | Admitting: Gynecologic Oncology

## 2018-03-23 ENCOUNTER — Ambulatory Visit (HOSPITAL_COMMUNITY): Payer: Medicare Other | Admitting: Anesthesiology

## 2018-03-23 ENCOUNTER — Encounter (HOSPITAL_COMMUNITY): Payer: Self-pay | Admitting: *Deleted

## 2018-03-23 ENCOUNTER — Encounter (HOSPITAL_COMMUNITY)
Admission: RE | Disposition: A | Discharge status: Home / Self Care | Payer: Self-pay | Source: Ambulatory Visit | Attending: Gynecologic Oncology

## 2018-03-23 DIAGNOSIS — N800 Endometriosis of uterus: Secondary | ICD-10-CM | POA: Diagnosis not present

## 2018-03-23 DIAGNOSIS — Z8249 Family history of ischemic heart disease and other diseases of the circulatory system: Secondary | ICD-10-CM | POA: Insufficient documentation

## 2018-03-23 DIAGNOSIS — I10 Essential (primary) hypertension: Secondary | ICD-10-CM | POA: Insufficient documentation

## 2018-03-23 DIAGNOSIS — E119 Type 2 diabetes mellitus without complications: Secondary | ICD-10-CM | POA: Diagnosis not present

## 2018-03-23 DIAGNOSIS — D251 Intramural leiomyoma of uterus: Secondary | ICD-10-CM | POA: Insufficient documentation

## 2018-03-23 DIAGNOSIS — Z7984 Long term (current) use of oral hypoglycemic drugs: Secondary | ICD-10-CM | POA: Insufficient documentation

## 2018-03-23 DIAGNOSIS — K219 Gastro-esophageal reflux disease without esophagitis: Secondary | ICD-10-CM | POA: Diagnosis not present

## 2018-03-23 DIAGNOSIS — N390 Urinary tract infection, site not specified: Secondary | ICD-10-CM | POA: Diagnosis present

## 2018-03-23 DIAGNOSIS — Z79899 Other long term (current) drug therapy: Secondary | ICD-10-CM | POA: Insufficient documentation

## 2018-03-23 DIAGNOSIS — Z7982 Long term (current) use of aspirin: Secondary | ICD-10-CM | POA: Diagnosis not present

## 2018-03-23 DIAGNOSIS — C541 Malignant neoplasm of endometrium: Secondary | ICD-10-CM | POA: Insufficient documentation

## 2018-03-23 HISTORY — PX: ROBOTIC ASSISTED TOTAL HYSTERECTOMY WITH BILATERAL SALPINGO OOPHERECTOMY: SHX6086

## 2018-03-23 HISTORY — PX: LYMPH NODE BIOPSY: SHX201

## 2018-03-23 LAB — TYPE AND SCREEN
ABO/RH(D): A POS
ANTIBODY SCREEN: NEGATIVE

## 2018-03-23 LAB — GLUCOSE, CAPILLARY
GLUCOSE-CAPILLARY: 95 mg/dL (ref 65–99)
Glucose-Capillary: 156 mg/dL — ABNORMAL HIGH (ref 65–99)

## 2018-03-23 SURGERY — HYSTERECTOMY, TOTAL, ROBOT-ASSISTED, LAPAROSCOPIC, WITH BILATERAL SALPINGO-OOPHORECTOMY
Anesthesia: General

## 2018-03-23 MED ORDER — ONDANSETRON HCL 4 MG/2ML IJ SOLN
INTRAMUSCULAR | Status: AC
  Filled 2018-03-23: qty 2

## 2018-03-23 MED ORDER — FENTANYL CITRATE (PF) 100 MCG/2ML IJ SOLN
INTRAMUSCULAR | Status: DC | PRN
  Administered 2018-03-23 (×5): 50 ug via INTRAVENOUS

## 2018-03-23 MED ORDER — KETOROLAC TROMETHAMINE 30 MG/ML IJ SOLN
INTRAMUSCULAR | Status: AC
  Filled 2018-03-23: qty 1

## 2018-03-23 MED ORDER — PROPOFOL 10 MG/ML IV BOLUS
INTRAVENOUS | Status: DC | PRN
  Administered 2018-03-23: 200 mg via INTRAVENOUS

## 2018-03-23 MED ORDER — SUGAMMADEX SODIUM 200 MG/2ML IV SOLN
INTRAVENOUS | Status: AC
  Filled 2018-03-23: qty 2

## 2018-03-23 MED ORDER — CEFAZOLIN SODIUM-DEXTROSE 2-4 GM/100ML-% IV SOLN
2.0000 g | INTRAVENOUS | Status: AC
  Administered 2018-03-23: 2 g via INTRAVENOUS
  Filled 2018-03-23: qty 100

## 2018-03-23 MED ORDER — PHENYLEPHRINE 40 MCG/ML (10ML) SYRINGE FOR IV PUSH (FOR BLOOD PRESSURE SUPPORT)
PREFILLED_SYRINGE | INTRAVENOUS | Status: DC | PRN
  Administered 2018-03-23: 120 ug via INTRAVENOUS

## 2018-03-23 MED ORDER — IBUPROFEN 800 MG PO TABS: 800.0000 mg | ORAL_TABLET | Freq: Three times a day (TID) | ORAL | 0 refills | Status: AC | PRN

## 2018-03-23 MED ORDER — ROCURONIUM BROMIDE 10 MG/ML (PF) SYRINGE
PREFILLED_SYRINGE | INTRAVENOUS | Status: DC | PRN
  Administered 2018-03-23: 50 mg via INTRAVENOUS
  Administered 2018-03-23: 10 mg via INTRAVENOUS
  Administered 2018-03-23: 20 mg via INTRAVENOUS

## 2018-03-23 MED ORDER — SCOPOLAMINE 1 MG/3DAYS TD PT72: 1.0000 | MEDICATED_PATCH | TRANSDERMAL | Status: DC

## 2018-03-23 MED ORDER — LIDOCAINE 2% (20 MG/ML) 5 ML SYRINGE
INTRAMUSCULAR | Status: AC
  Filled 2018-03-23: qty 5

## 2018-03-23 MED ORDER — HYDROMORPHONE HCL 1 MG/ML IJ SOLN
INTRAMUSCULAR | Status: AC
  Administered 2018-03-23: 0.5 mg via INTRAVENOUS
  Filled 2018-03-23: qty 1

## 2018-03-23 MED ORDER — ARTIFICIAL TEARS OPHTHALMIC OINT
TOPICAL_OINTMENT | OPHTHALMIC | Status: AC
  Filled 2018-03-23: qty 3.5

## 2018-03-23 MED ORDER — STERILE WATER FOR IRRIGATION IR SOLN
Status: DC | PRN
  Administered 2018-03-23: 1000 mL

## 2018-03-23 MED ORDER — STERILE WATER FOR INJECTION IJ SOLN
INTRAMUSCULAR | Status: DC | PRN
  Administered 2018-03-23: 10 mL via VAGINAL

## 2018-03-23 MED ORDER — FENTANYL CITRATE (PF) 250 MCG/5ML IJ SOLN
INTRAMUSCULAR | Status: AC
  Filled 2018-03-23: qty 5

## 2018-03-23 MED ORDER — DEXAMETHASONE SODIUM PHOSPHATE 10 MG/ML IJ SOLN
INTRAMUSCULAR | Status: AC
  Filled 2018-03-23: qty 1

## 2018-03-23 MED ORDER — ENOXAPARIN SODIUM 40 MG/0.4ML ~~LOC~~ SOLN
40.0000 mg | SUBCUTANEOUS | Status: AC
  Administered 2018-03-23: 40 mg via SUBCUTANEOUS
  Filled 2018-03-23: qty 0.4

## 2018-03-23 MED ORDER — KETOROLAC TROMETHAMINE 30 MG/ML IJ SOLN
30.0000 mg | Freq: Once | INTRAMUSCULAR | Status: AC
  Administered 2018-03-23: 30 mg via INTRAVENOUS

## 2018-03-23 MED ORDER — PHENYLEPHRINE 40 MCG/ML (10ML) SYRINGE FOR IV PUSH (FOR BLOOD PRESSURE SUPPORT)
PREFILLED_SYRINGE | INTRAVENOUS | Status: AC
  Filled 2018-03-23: qty 10

## 2018-03-23 MED ORDER — ONDANSETRON HCL 4 MG/2ML IJ SOLN
INTRAMUSCULAR | Status: DC | PRN
  Administered 2018-03-23: 4 mg via INTRAVENOUS

## 2018-03-23 MED ORDER — SUGAMMADEX SODIUM 200 MG/2ML IV SOLN
INTRAVENOUS | Status: DC | PRN
Start: 2018-03-23 — End: 2018-03-23
  Administered 2018-03-23: 200 mg via INTRAVENOUS

## 2018-03-23 MED ORDER — ROCURONIUM BROMIDE 10 MG/ML (PF) SYRINGE
PREFILLED_SYRINGE | INTRAVENOUS | Status: AC
  Filled 2018-03-23: qty 5

## 2018-03-23 MED ORDER — LIDOCAINE 2% (20 MG/ML) 5 ML SYRINGE
INTRAMUSCULAR | Status: DC | PRN
  Administered 2018-03-23: 80 mg via INTRAVENOUS

## 2018-03-23 MED ORDER — FLUORESCEIN SODIUM 10 % IV SOLN
INTRAVENOUS | Status: DC | PRN
  Administered 2018-03-23: 1 mL via INTRAVENOUS

## 2018-03-23 MED ORDER — MIDAZOLAM HCL 2 MG/2ML IJ SOLN
INTRAMUSCULAR | Status: AC
  Filled 2018-03-23: qty 2

## 2018-03-23 MED ORDER — MIDAZOLAM HCL 2 MG/2ML IJ SOLN
INTRAMUSCULAR | Status: DC | PRN
  Administered 2018-03-23: 2 mg via INTRAVENOUS

## 2018-03-23 MED ORDER — SCOPOLAMINE 1 MG/3DAYS TD PT72
1.0000 | MEDICATED_PATCH | TRANSDERMAL | Status: DC
  Administered 2018-03-23: 1.5 mg via TRANSDERMAL
  Filled 2018-03-23: qty 1

## 2018-03-23 MED ORDER — LACTATED RINGERS IV SOLN
INTRAVENOUS | Status: DC | PRN
  Administered 2018-03-23 (×2): via INTRAVENOUS

## 2018-03-23 MED ORDER — OXYCODONE-ACETAMINOPHEN 5-325 MG PO TABS: 1.0000 | ORAL_TABLET | ORAL | 0 refills | Status: DC | PRN

## 2018-03-23 MED ORDER — SENNA 8.6 MG PO TABS: 1.0000 | ORAL_TABLET | Freq: Every day | ORAL | 1 refills | Status: DC

## 2018-03-23 MED ORDER — GABAPENTIN 300 MG PO CAPS
300.0000 mg | ORAL_CAPSULE | ORAL | Status: AC
  Administered 2018-03-23: 300 mg via ORAL
  Filled 2018-03-23: qty 1

## 2018-03-23 MED ORDER — MEPERIDINE HCL 50 MG/ML IJ SOLN: 6.2500 mg | INTRAMUSCULAR | Status: DC | PRN

## 2018-03-23 MED ORDER — STERILE WATER FOR INJECTION IJ SOLN
INTRAMUSCULAR | Status: AC
Start: 2018-03-23 — End: ?
  Filled 2018-03-23: qty 10

## 2018-03-23 MED ORDER — LACTATED RINGERS IR SOLN
Status: DC | PRN
  Administered 2018-03-23: 1000 mL

## 2018-03-23 MED ORDER — METOCLOPRAMIDE HCL 5 MG/ML IJ SOLN: 10.0000 mg | Freq: Once | INTRAMUSCULAR | Status: DC | PRN

## 2018-03-23 MED ORDER — ACETAMINOPHEN 500 MG PO TABS
1000.0000 mg | ORAL_TABLET | ORAL | Status: AC
  Administered 2018-03-23: 1000 mg via ORAL
  Filled 2018-03-23: qty 2

## 2018-03-23 MED ORDER — DEXAMETHASONE SODIUM PHOSPHATE 4 MG/ML IJ SOLN
4.0000 mg | INTRAMUSCULAR | Status: AC
  Administered 2018-03-23: 10 mg via INTRAVENOUS

## 2018-03-23 MED ORDER — HYDROMORPHONE HCL 1 MG/ML IJ SOLN
0.2500 mg | INTRAMUSCULAR | Status: DC | PRN
  Administered 2018-03-23 (×2): 0.5 mg via INTRAVENOUS

## 2018-03-23 MED ORDER — PROPOFOL 10 MG/ML IV BOLUS
INTRAVENOUS | Status: AC
  Filled 2018-03-23: qty 40

## 2018-03-23 MED ORDER — CELECOXIB 200 MG PO CAPS
400.0000 mg | ORAL_CAPSULE | ORAL | Status: AC
  Administered 2018-03-23: 400 mg via ORAL
  Filled 2018-03-23: qty 2

## 2018-03-23 SURGICAL SUPPLY — 49 items
APPLICATOR SURGIFLO ENDO (HEMOSTASIS) IMPLANT
BAG LAPAROSCOPIC 12 15 PORT 16 (BASKET) ×2 IMPLANT
BAG RETRIEVAL 12/15 (BASKET) ×3
COVER BACK TABLE 60X90IN (DRAPES) ×3 IMPLANT
COVER SURGICAL LIGHT HANDLE (MISCELLANEOUS) ×3 IMPLANT
COVER TIP SHEARS 8 DVNC (MISCELLANEOUS) ×2 IMPLANT
COVER TIP SHEARS 8MM DA VINCI (MISCELLANEOUS) ×1
DERMABOND ADVANCED (GAUZE/BANDAGES/DRESSINGS) ×5
DERMABOND ADVANCED .7 DNX12 (GAUZE/BANDAGES/DRESSINGS) ×10 IMPLANT
DRAPE ARM DVNC X/XI (DISPOSABLE) ×8 IMPLANT
DRAPE COLUMN DVNC XI (DISPOSABLE) ×2 IMPLANT
DRAPE DA VINCI XI ARM (DISPOSABLE) ×4
DRAPE DA VINCI XI COLUMN (DISPOSABLE) ×1
DRAPE SHEET LG 3/4 BI-LAMINATE (DRAPES) ×3 IMPLANT
DRAPE SURG IRRIG POUCH 19X23 (DRAPES) ×3 IMPLANT
DRAPE UTILITY XL STRL (DRAPES) ×3 IMPLANT
ELECT REM PT RETURN 15FT ADLT (MISCELLANEOUS) ×3 IMPLANT
GLOVE BIO SURGEON STRL SZ 6 (GLOVE) ×15 IMPLANT
GLOVE BIO SURGEON STRL SZ 6.5 (GLOVE) ×6 IMPLANT
GOWN STRL REUS W/ TWL LRG LVL3 (GOWN DISPOSABLE) ×4 IMPLANT
GOWN STRL REUS W/TWL LRG LVL3 (GOWN DISPOSABLE) ×2
HOLDER FOLEY CATH W/STRAP (MISCELLANEOUS) ×3 IMPLANT
IRRIG SUCT STRYKERFLOW 2 WTIP (MISCELLANEOUS) ×3
IRRIGATION SUCT STRKRFLW 2 WTP (MISCELLANEOUS) ×2 IMPLANT
KIT PROCEDURE DA VINCI SI (MISCELLANEOUS) ×1
KIT PROCEDURE DVNC SI (MISCELLANEOUS) ×2 IMPLANT
MANIPULATOR UTERINE 4.5 ZUMI (MISCELLANEOUS) ×3 IMPLANT
NEEDLE SPNL 18GX3.5 QUINCKE PK (NEEDLE) ×3 IMPLANT
OBTURATOR OPTICAL STANDARD 8MM (TROCAR) ×1
OBTURATOR OPTICAL STND 8 DVNC (TROCAR) ×2
OBTURATOR OPTICALSTD 8 DVNC (TROCAR) ×2 IMPLANT
PACK ROBOT GYN CUSTOM WL (TRAY / TRAY PROCEDURE) IMPLANT
PACK ROBOT UROLOGY CUSTOM (CUSTOM PROCEDURE TRAY) ×3 IMPLANT
PAD POSITIONING PINK XL (MISCELLANEOUS) ×3 IMPLANT
POUCH SPECIMEN RETRIEVAL 10MM (ENDOMECHANICALS) IMPLANT
SEAL CANN UNIV 5-8 DVNC XI (MISCELLANEOUS) ×8 IMPLANT
SEAL XI 5MM-8MM UNIVERSAL (MISCELLANEOUS) ×4
SET TRI-LUMEN FLTR TB AIRSEAL (TUBING) ×3 IMPLANT
SURGIFLO W/THROMBIN 8M KIT (HEMOSTASIS) IMPLANT
SUT VIC AB 0 CT1 27 (SUTURE) ×3
SUT VIC AB 0 CT1 27XBRD ANTBC (SUTURE) ×6 IMPLANT
SUT VIC AB 3-0 SH 27 (SUTURE) ×1
SUT VIC AB 3-0 SH 27XBRD (SUTURE) ×2 IMPLANT
SYR 10ML LL (SYRINGE) ×3 IMPLANT
TOWEL OR NON WOVEN STRL DISP B (DISPOSABLE) ×3 IMPLANT
TRAP SPECIMEN MUCOUS 40CC (MISCELLANEOUS) IMPLANT
TRAY FOLEY MTR SLVR 16FR STAT (SET/KITS/TRAYS/PACK) ×3 IMPLANT
UNDERPAD 30X30 (UNDERPADS AND DIAPERS) ×3 IMPLANT
WATER STERILE IRR 1000ML POUR (IV SOLUTION) ×6 IMPLANT

## 2018-03-23 NOTE — H&P (Signed)
Consult Note: Gyn-Onc  Consult was requested by Dr. Leo Kelley for the evaluation of Kiara Kelley 67 y.o. female  CC:  No chief complaint on file.   Assessment/Plan:  [name]  is a 67 y.o.  year old with grade 1 endometrioid endometrial adenocarcinoma.  A detailed discussion was held with the patient and her family with regard to to her endometrial cancer diagnosis. We discussed the standard management options for uterine cancer which includes surgery followed possibly by adjuvant therapy depending on the results of surgery. The options for surgical management include a hysterectomy and removal of the tubes and ovaries possibly with removal of pelvic and para-aortic lymph nodes.If feasible, a minimally invasive approach including a robotic hysterectomy or laparoscopic hysterectomy have benefits including shorter hospital stay, recovery time and better wound healing than with open surgery. The patient has been counseled about these surgical options and the risks of surgery in general including infection, bleeding, damage to surrounding structures (including bowel, bladder, ureters, nerves or vessels), and the postoperative risks of PE/ DVT, and lymphedema. I extensively reviewed the additional risks of robotic hysterectomy including possible need for conversion to open laparotomy.  I discussed positioning during surgery of trendelenberg and risks of minor facial swelling and care we take in preoperative positioning.  After counseling and consideration of her options, she desires to proceed with robotic assisted total hysterectomy with bilateral sapingo-oophorectomy and SLN biopsy.   She will be seen by anesthesia for preoperative clearance and discussion of postoperative pain management.  She was given the opportunity to ask questions, which were answered to her satisfaction, and she is agreement with the above mentioned plan of care.  She will hold her fishoil preoperatively to reduce  bleeding risk.   HPI: [name] is a very pleasant 67 year old P3 who is seen in consultation at the request of Dr Kiara Kelley for grade 1 endometrial cancer.  Patient reports having an episode of vaginal bleeding in mid March 2019.  She was immediately seen by her gynecologist, Dr. Biagio Kelley, who promptly performed a transvaginal ultrasound scan which revealed a slightly enlarged uterus measuring approximately 10 cm with some intramural fibroids seen the largest of which was 3.8 cm.  The endometrial thickness was 11.6.  She was then scheduled for a hysteroscopy D&C and vulvar biopsy of pigmented vulvar lesions which took place on January 29, 2018.  The vulvar biopsy revealed lichen sclerosis however the endometrial curettage revealed a FIGO grade 1 endometrioid adenocarcinoma.  Kiara Kelley has a history of type 2 diabetes from young age.  She takes metformin for this and glimepiride.  She has had no prior abdominal surgeries.  Her family history is significant only for a father with lung cancer.  Diabetes runs strongly in her family.  Current Meds:  Outpatient Encounter Medications as of 02/18/2018  Medication Sig  . aspirin 81 MG tablet Take 81 mg by mouth every evening. 2 tabs  . colesevelam (WELCHOL) 625 MG tablet Take 1,875 mg by mouth every evening.   Marland Kitchen glimepiride (AMARYL) 4 MG tablet Take 4 mg by mouth daily with breakfast.  . ibuprofen (ADVIL,MOTRIN) 600 MG tablet Take 600mg  by mouth every 6 hours for 3 days, then every 6 hours as needed for pain  . lisinopril (PRINIVIL,ZESTRIL) 2.5 MG tablet Take 2.5 mg by mouth daily.  . metFORMIN (GLUCOPHAGE) 1000 MG tablet Take 1,000 mg by mouth 2 (two) times daily with a meal.  . Omega-3 Fatty Acids (FISH OIL) 1200  MG CAPS Take by mouth. 1 in pm  . simvastatin (ZOCOR) 40 MG tablet Take 40 mg by mouth daily at 6 PM.  . sitaGLIPtin (JANUVIA) 100 MG tablet Take 100 mg by mouth every evening. Takes 1/2 pill in pm  . UNABLE TO FIND Vitamin d 3 2000 iu pm   No  facility-administered encounter medications on file as of 02/18/2018.     Allergy: No Known Allergies  Social Hx:   Social History   Socioeconomic History  . Marital status: Married    Spouse name: Not on file  . Number of children: Not on file  . Years of education: Not on file  . Highest education level: Not on file  Occupational History  . Not on file  Social Needs  . Financial resource strain: Not on file  . Food insecurity:    Worry: Not on file    Inability: Not on file  . Transportation needs:    Medical: Not on file    Non-medical: Not on file  Tobacco Use  . Smoking status: Never Smoker  . Smokeless tobacco: Never Used  Substance and Sexual Activity  . Alcohol use: Yes    Comment: social only  . Drug use: No  . Sexual activity: Yes  Lifestyle  . Physical activity:    Days per week: Not on file    Minutes per session: Not on file  . Stress: Not on file  Relationships  . Social connections:    Talks on phone: Not on file    Gets together: Not on file    Attends religious service: Not on file    Active member of club or organization: Not on file    Attends meetings of clubs or organizations: Not on file    Relationship status: Not on file  . Intimate partner violence:    Fear of current or ex partner: Not on file    Emotionally abused: Not on file    Physically abused: Not on file    Forced sexual activity: Not on file  Other Topics Concern  . 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 CATARACT EXTRACTION W/ INTRAOCULAR LENS  IMPLANT, BILATERAL  2011  . COLONOSCOPY WITH PROPOFOL N/A 09/14/2013   Procedure: COLONOSCOPY WITH PROPOFOL;  Surgeon: Garlan Fair, MD;  Location: [facility];  Service: Endoscopy;  Laterality: N/A;  . DILATATION & CURRETTAGE/HYSTEROSCOPY WITH RESECTOCOPE N/A 01/29/2018   Procedure: DILATATION & CURETTAGE/HYSTEROSCOPY  WITH RESECTOCOPE;  Surgeon: Eldred Manges, MD;  Location: [place];  Service: Gynecology;  Laterality: N/A;  . VULVA Kiara Kelley BIOPSY N/A 01/29/2018   Procedure: VULVAR BIOPSY;  Surgeon: Eldred Manges, MD;  Location: [place];  Service: Gynecology;  Laterality: N/A;  . WISDOM TOOTH EXTRACTION      Past Medical Hx:  Past Medical History:  Diagnosis Date  . GERD (gastroesophageal reflux disease)   . Hypertension   . PONV (postoperative nausea and vomiting)   . Type 2 diabetes mellitus ([place])    followed by pcp, dr Lynnda Child    Past Gynecological History:  SVD x 3 No LMP recorded. Patient is postmenopausal.  Family Hx:  Family History  Problem Relation Age of Onset  . Heart disease Mother   . Lung cancer Father   . Diabetes Brother     Review  of Systems:  Constitutional  Feels well,    ENT Normal appearing ears and nares bilaterally Skin/Breast  No rash, sores, jaundice, itching, dryness Cardiovascular  No chest pain, shortness of breath, or edema  Pulmonary  No cough or wheeze.  Gastro Intestinal  No nausea, vomitting, or diarrhoea. No bright red blood per rectum, no abdominal pain, change in bowel movement, or constipation.  Genito Urinary  No frequency, urgency, dysuria, + postmenopausal bleeding Musculo Skeletal  No myalgia, arthralgia, joint swelling or pain  Neurologic  No weakness, numbness, change in gait,  Psychology  No depression, anxiety, insomnia.   Vitals:  Blood pressure 127/73, pulse 76, temperature 98.4 F (36.9 C), temperature source Oral, resp. rate 18, height 5' 2.75" (1.594 m), weight 142 lb (64.4 kg), SpO2 96 %.  Physical Exam: WD in NAD Neck  Supple NROM, without any enlargements.  Lymph Node Survey No cervical supraclavicular or inguinal adenopathy Cardiovascular  Pulse normal rate, regularity and rhythm. S1 and S2 normal.  Lungs  Clear to auscultation bilateraly, without  wheezes/crackles/rhonchi. Good air movement.  Skin  No rash/lesions/breakdown  Psychiatry  Alert and oriented to person, place, and time  Abdomen  Normoactive bowel sounds, abdomen soft, non-tender and nonobese without evidence of hernia.  Back No CVA tenderness Genito Urinary  Vulva/vagina: Normal external female genitalia.   No lesions. No discharge or bleeding.  Bladder/urethra:  No lesions or masses, well supported bladder  Vagina: no lesions or masses  Cervix: Normal appearing, no lesions.  Uterus:  Slightly globular and 10cm, mobile, no parametrial involvement or nodularity.  Adnexa: no palpable masses. Rectal  deferred Extremities  No bilateral cyanosis, clubbing or edema.   Thereasa Solo, MD  03/23/2018, 7:01 AM

## 2018-03-23 NOTE — Anesthesia Postprocedure Evaluation (Signed)
Anesthesia Post Note  Patient: Kiara Kelley  Procedure(s) Performed: XI ROBOTIC ASSISTED TOTAL LAPAROSCOPIC  HYSTERECTOMY WITH BILATERAL SALPINGO OOPHORECTOMY (Bilateral ) SENTINEL LYMPH NODE BIOPSY (N/A )     Patient location during evaluation: PACU Anesthesia Type: General Level of consciousness: awake and alert and oriented Pain management: pain level controlled Vital Signs Assessment: post-procedure vital signs reviewed and stable Respiratory status: spontaneous breathing, nonlabored ventilation and respiratory function stable Cardiovascular status: blood pressure returned to baseline and stable Postop Assessment: no apparent nausea or vomiting Anesthetic complications: no    Last Vitals:  Vitals:   03/23/18 1100 03/23/18 1115  BP: 128/66 124/61  Pulse: 70 65  Resp: 12 15  Temp: 36.7 C 36.8 C  SpO2: 100%     Last Pain:  Vitals:   03/23/18 1115  TempSrc:   PainSc: 0-No pain                 Iliana Hutt A.

## 2018-03-23 NOTE — Discharge Instructions (Signed)
03/23/2018  Return to work: 4 weeks  Activity: 1. Be up and out of the bed during the day.  Take a nap if needed.  You may walk up steps but be careful and use the hand rail.  Stair climbing will tire you more than you think, you may need to stop part way and rest.   2. No lifting or straining for 6 weeks.  3. No driving for 2 weeks.  Do Not drive if you are taking narcotic pain medicine.  4. Shower daily.  Use soap and water on your incision and pat dry; don't rub.   5. No sexual activity and nothing in the vagina for 8 weeks.  Medications:  - Take ibuprofen and tylenol first line for pain control. Take these regularly (every 6 hours) to decrease the build up of pain.  - If necessary, for severe pain not relieved by ibuprofen, take percocet.  - While taking percocet you should take sennakot every night to reduce the likelihood of constipation. If this causes diarrhea, stop its use.  Diet: 1. Low sodium Heart Healthy Diet is recommended.  2. It is safe to use a laxative if you have difficulty moving your bowels.   Wound Care: 1. Keep clean and dry.  Shower daily.  Reasons to call the Doctor:   Fever - Oral temperature greater than 100.4 degrees Fahrenheit  Foul-smelling vaginal discharge  Difficulty urinating  Nausea and vomiting  Increased pain at the site of the incision that is unrelieved with pain medicine.  Difficulty breathing with or without chest pain  New calf pain especially if only on one side  Sudden, continuing increased vaginal bleeding with or without clots.   Follow-up: 1. See Everitt Amber in 3 weeks.  Contacts: For questions or concerns you should contact:  Dr. Everitt Amber at [phone] After hours and on week-ends call [phone] and ask to speak to the physician on call for Gynecologic Oncology

## 2018-03-23 NOTE — Op Note (Signed)
OPERATIVE NOTE 03/23/18  Surgeon: Donaciano Eva   Assistants: Dr Precious Haws (an MD assistant was necessary for tissue manipulation, management of robotic instrumentation, retraction and positioning due to the complexity of the case and hospital policies).   Anesthesia: General endotracheal anesthesia  ASA Class: 3   Pre-operative Diagnosis: endometrial cancer grade 1  Post-operative Diagnosis: same,   Operation: Robotic-assisted laparoscopic total hysterectomy >250gm with bilateral salpingoophorectomy, SLN biopsy   Surgeon: Donaciano Eva  Assistant Surgeon: Lahoma Crocker MD  Anesthesia: GET  Urine Output: 200  Operative Findings:  : bulky, fibroid 10cmx10cm uterus. Normal appearing tubes and ovaries. No suspicious nodes.  Estimated Blood Loss:  <25cc      Total IV Fluids: 600 ml         Specimens: uterus, cervix, bilateral tubes and ovaries, right obturator SLN, left external iliac SLN.         Complications:  None; patient tolerated the procedure well.         Disposition: PACU - hemodynamically stable.  Procedure Details  The patient was seen in the Holding Room. The risks, benefits, complications, treatment options, and expected outcomes were discussed with the patient.  The patient concurred with the proposed plan, giving informed consent.  The site of surgery properly noted/marked. The patient was identified as Kiara Kelley and the procedure verified as a Robotic-assisted hysterectomy with bilateral salpingo oophorectomy with SLN biopsy. A Time Out was held and the above information confirmed.  After induction of anesthesia, the patient was draped and prepped in the usual sterile manner. Pt was placed in supine position after anesthesia and draped and prepped in the usual sterile manner. The abdominal drape was placed after the CholoraPrep had been allowed to dry for 3 minutes.  Her arms were tucked to her side with all appropriate precautions.  The  shoulders were stabilized with padded shoulder blocks applied to the acromium processes.  The patient was placed in the semi-lithotomy position in [place].  The perineum was prepped with Betadine. The patient was then prepped. Foley catheter was placed.  A sterile speculum was placed in the vagina.  The cervix was grasped with a single-tooth tenaculum. 2mg  total of ICG was injected into the cervical stroma at 2 and 9 o'clock with 1cc injected at a 1cm and 34mm depth (concentration 0.5mg /ml) in all locations. The cervix was dilated with Kennon Rounds dilators.  The ZUMI uterine manipulator with a medium colpotomizer ring was placed without difficulty.  A pneum occluder balloon was placed over the manipulator.  OG tube placement was confirmed and to suction.   Next, a 5 mm skin incision was made 1 cm below the subcostal margin in the midclavicular line.  The 5 mm Optiview port and scope was used for direct entry.  Opening pressure was under 10 mm CO2.  The abdomen was insufflated and the findings were noted as above.   At this point and all points during the procedure, the patient's intra-abdominal pressure did not exceed 15 mmHg. Next, a 10 mm skin incision was made in the umbilicus and a right and left port was placed about 10 cm lateral to the robot port on the right and left side.  A fourth arm was placed in the left lower quadrant 2 cm above and superior and medial to the anterior superior iliac spine.  All ports were placed under direct visualization.  The patient was placed in steep Trendelenburg.  Bowel was folded away into the upper abdomen.  The robot was docked in the normal manner.  The right and left peritoneum were opened parallel to the IP ligament to open the retroperitoneal spaces bilaterally. The SLN mapping was performed in bilateral pelvic basins. The para rectal and paravesical spaces were opened up entirely with careful dissection below the level of the ureters bilaterally and to the depth of  the uterine artery origin in order to skeletonize the uterine "web" and ensure visualization of all parametrial channels. The para-aortic basins were carefully exposed and evaluated for isolated para-aortic SLN's. Lymphatic channels were identified travelling to the following visualized sentinel lymph node's: right obturator and left external iliac. These SLN's were separated from their surrounding lymphatic tissue, removed and sent for permanent pathology.  The hysterectomy was started after the round ligament on the right side was incised and the retroperitoneum was entered and the pararectal space was developed.  The ureter was noted to be on the medial leaf of the broad ligament.  The peritoneum above the ureter was incised and stretched and the infundibulopelvic ligament was skeletonized, cauterized and cut.  The posterior peritoneum was taken down to the level of the KOH ring.  The anterior peritoneum was also taken down.  The bladder flap was created to the level of the KOH ring.  The uterine artery on the right side was skeletonized, cauterized and cut in the normal manner.  A similar procedure was performed on the left.  The colpotomy was made with an additional vertical incision on the anterior vaginal wall approximately 3cm in the midline with care to avoid the reflected bladder. This was performed given the bulky uterine size and anticipated difficulty with specimen delivery. The uterus, cervix, bilateral ovaries and tubes were attempted to be delivered through the vagina. In the process, with the cervix grasped with tenacula, the cervix avulsed from the fundal specimen. An additional fibroid was also avulsed from the specimen. A 15cm endocatch bag was inserted through the assistant port and the specimen was bagged and then delivered through the vagina. The specimen was sent to pathology with the detached fibroid and cervix.  Pedicles were inspected and excellent hemostasis was achieved.    The  colpotomy at the vaginal cuff was closed with Vicryl on a CT1 needle in running manner, first closing the anterior incision first followed by the transverse incision.  Copious irrigation was used and excellent hemostasis was achieved.  At this point in the procedure was completed.  Robotic instruments were removed under direct visulaization.  The robot was undocked. The 10 mm ports were closed with Vicryl on a UR-5 needle and the fascia was closed with 0 Vicryl on a UR-5 needle.  The skin was closed with 4-0 Vicryl in a subcuticular manner.  Dermabond was applied.  Sponge, lap and needle counts correct x 2.   The vagina was swabbed with  minimal bleeding noted from the mucosa in the midline posterior perineum and the periurethral tissues. This was made hemostatic with 3-0 vicryl suture.   All instrument and needle counts were correct x  3. The foley was removed.   The patient was transferred to the recovery room in a stable condition.  Donaciano Eva, MD

## 2018-03-23 NOTE — Transfer of Care (Signed)
   Last Vitals:  Vitals Value Taken Time  BP 144/71 03/23/2018  9:30 AM  Temp 36.4 C 03/23/2018  9:27 AM  Pulse 67 03/23/2018  9:34 AM  Resp 18 03/23/2018  9:34 AM  SpO2 99 % 03/23/2018  9:34 AM  Vitals shown include unvalidated device data.  Last Pain:  Vitals:   03/23/18 0647  TempSrc:   PainSc: 0-No pain         Immediate Anesthesia Transfer of Care Note  Patient: Kiara Kelley  Procedure(s) Performed: Procedure(s) (LRB): XI ROBOTIC ASSISTED TOTAL LAPAROSCOPIC  HYSTERECTOMY WITH BILATERAL SALPINGO OOPHORECTOMY (Bilateral) SENTINEL LYMPH NODE BIOPSY (N/A)  Patient Location: PACU  Anesthesia Type: General  Level of Consciousness: awake, alert  and oriented  Airway & Oxygen Therapy: Patient Spontanous Breathing and Patient connected to face mask oxygen  Post-op Assessment: Report given to PACU RN and Post -op Vital signs reviewed and stable  Post vital signs: Reviewed and stable  Complications: No apparent anesthesia complications

## 2018-03-23 NOTE — Anesthesia Procedure Notes (Signed)
Procedure Name: Intubation Date/Time: 03/23/2018 7:37 AM Performed by: Josephine Igo, MD Pre-anesthesia Checklist: Patient identified, Emergency Drugs available, Suction available and Patient being monitored Patient Re-evaluated:Patient Re-evaluated prior to induction Oxygen Delivery Method: Circle system utilized Preoxygenation: Pre-oxygenation with 100% oxygen Induction Type: IV induction Ventilation: Mask ventilation without difficulty Laryngoscope Size: Mac and 3 Grade View: Grade I Tube type: Oral Tube size: 7.0 mm Number of attempts: 1 Airway Equipment and Method: Stylet Placement Confirmation: ETT inserted through vocal cords under direct vision,  positive ETCO2 and breath sounds checked- equal and bilateral Secured at: 21 cm Tube secured with: Tape Dental Injury: Teeth and Oropharynx as per pre-operative assessment

## 2018-03-25 ENCOUNTER — Telehealth: Payer: Self-pay | Admitting: Gynecologic Oncology

## 2018-03-25 ENCOUNTER — Encounter: Payer: Self-pay | Admitting: Gynecologic Oncology

## 2018-03-25 NOTE — Telephone Encounter (Signed)
Informed patient of pathology of stage IA grade 1 endometrial cancer. Low risk features. No adjuvant therapy recommended. Will enter close surveillance.  Doing well postop with no concerns.  Thereasa Solo, MD

## 2018-04-08 ENCOUNTER — Telehealth: Payer: Self-pay

## 2018-04-08 NOTE — Telephone Encounter (Signed)
Pt stated that she began with some vaginal  spotting within the last 3 days.  There is light pink drainage on the toilet tissue. No urinary burning or frequency.   Pt 's surgery was 03-23-18.  Told her that she can have some spotting for ~4-6 weeks post op.  The sutures are dissolving and cause some spotting.  She needs to call if bleeding increased like a period. Pt states that she is doing well.

## 2018-04-16 ENCOUNTER — Ambulatory Visit: Payer: Medicare Other | Admitting: Gynecologic Oncology

## 2018-04-22 ENCOUNTER — Encounter: Payer: Self-pay | Admitting: Gynecologic Oncology

## 2018-04-22 ENCOUNTER — Telehealth: Payer: Self-pay | Admitting: Oncology

## 2018-04-22 ENCOUNTER — Inpatient Hospital Stay: Payer: Medicare Other | Attending: Gynecologic Oncology | Admitting: Gynecologic Oncology

## 2018-04-22 VITALS — BP 134/49 | HR 70 | Temp 98.3°F | Resp 20 | Ht 62.0 in | Wt 144.8 lb

## 2018-04-22 DIAGNOSIS — C541 Malignant neoplasm of endometrium: Secondary | ICD-10-CM | POA: Insufficient documentation

## 2018-04-22 DIAGNOSIS — Z90710 Acquired absence of both cervix and uterus: Secondary | ICD-10-CM | POA: Diagnosis not present

## 2018-04-22 DIAGNOSIS — Z90722 Acquired absence of ovaries, bilateral: Secondary | ICD-10-CM

## 2018-04-22 DIAGNOSIS — Z7189 Other specified counseling: Secondary | ICD-10-CM

## 2018-04-22 MED ORDER — ESTROGENS, CONJUGATED 0.625 MG/GM VA CREA: 1.0000 | TOPICAL_CREAM | VAGINAL | 1 refills | Status: DC

## 2018-04-22 NOTE — Telephone Encounter (Signed)
Requested MSI testing on accession # [id number] with Maudie Mercury in [facility] pathology.

## 2018-04-22 NOTE — Progress Notes (Signed)
Follow-up Note: Gyn-Onc  Consult was requested by Dr. Leo Grosser for the evaluation of Kiara Kelley 67 y.o. female  CC:  Chief Complaint  Patient presents with  . Endometrial cancer [facility])    Assessment/Plan:  Kiara Kelley  is a 67 y.o.  year old with stage IA grade 1 endometrial cancer.  Pathology revealed low risk factors for recurrence, therefore no adjuvant therapy is recommended according to NCCN guidelines.  I discussed risk for recurrence and typical symptoms encouraged her to notify us of these should they develop between visits.  I recommend she have follow-up every 6 months for 5 years in accordance with NCCN guidelines. Those visits should include symptom assessment, physical exam and pelvic examination. Pap smears are not indicated or recommended in the routine surveillance of endometrial cancer.  She will follow-up with me in 6 months and Dr Leo Grosser in 12 months.  I have prescribed her premarin cream for 1 month to improve vaginal cuff healing.   Needs MSI testing on tumor.   HPI: Kiara Kelley is a very pleasant 67 year old P3 who is seen in consultation at the request of Dr Leo Grosser for grade 1 endometrial cancer.  Patient reports having an episode of vaginal bleeding in mid March 2019.  She was immediately seen by her gynecologist, Dr. Biagio Quint, who promptly performed a transvaginal ultrasound scan which revealed a slightly enlarged uterus measuring approximately 10 cm with some intramural fibroids seen the largest of which was 3.8 cm.  The endometrial thickness was 11.6.  She was then scheduled for a hysteroscopy D&C and vulvar biopsy of pigmented vulvar lesions which took place on January 29, 2018.  The vulvar biopsy revealed lichen sclerosis however the endometrial curettage revealed a FIGO grade 1 endometrioid adenocarcinoma.  Kiara Kelley has a history of type 2 diabetes from young age.  She takes metformin for this and glimepiride.  She has had no prior abdominal  surgeries.  Her family history is significant only for a father with lung cancer.  Diabetes runs strongly in her family.  Interval Hx:  On 03/23/18 she underwent robotic assisted total hysterectomy, BSO and SLN biopsy. Final pathology showed a grade 1 endometrioid endometrial cancer with no myo invasion and negative nodes. She was staged as stage IA grade 1 carcinoma with low risk factors for recurrence and therefore no adjuvant therapy was prescribed.   Current Meds:  Outpatient Encounter Medications as of 04/22/2018  Medication Sig  . Cholecalciferol (VITAMIN D) 2000 units tablet Take 2,000 Units by mouth daily.  . clobetasol ointment (TEMOVATE) 2.03 % Apply 1 application topically 2 (two) times daily as needed (skin irritation).  Marland Kitchen glimepiride (AMARYL) 4 MG tablet Take 4 mg by mouth daily with breakfast.  . ibuprofen (ADVIL,MOTRIN) 800 MG tablet Take 1 tablet (800 mg total) by mouth every 8 (eight) hours as needed.  Marland Kitchen lisinopril (PRINIVIL,ZESTRIL) 20 MG tablet Take 20 mg by mouth daily.   . metFORMIN (GLUCOPHAGE) 1000 MG tablet Take 1,000 mg by mouth 2 (two) times daily with a meal.  . Multiple Vitamins-Minerals (MULTIVITAMIN WITH MINERALS) tablet Take 1 tablet by mouth daily.  . Omega-3 Fatty Acids (FISH OIL) 1200 MG CAPS Take 1 capsule by mouth daily.   Marland Kitchen senna (SENOKOT) 8.6 MG TABS tablet Take 1 tablet (8.6 mg total) by mouth at bedtime.  . simvastatin (ZOCOR) 40 MG tablet Take 40 mg by mouth daily at 6 PM.  . [DISCONTINUED] aspirin 81 MG tablet Take 162 mg by  mouth every evening.   . [DISCONTINUED] colesevelam (WELCHOL) 625 MG tablet Take 625 mg by mouth every evening.   . [DISCONTINUED] nitrofurantoin, macrocrystal-monohydrate, (MACROBID) 100 MG capsule Take 1 capsule (100 mg total) by mouth 2 (two) times daily. (Patient not taking: Reported on 04/22/2018)  . [DISCONTINUED] oxyCODONE-acetaminophen (PERCOCET) 5-325 MG tablet Take 1-2 tablets by mouth every 4 (four) hours as needed for severe  pain. (Patient not taking: Reported on 04/22/2018)   No facility-administered encounter medications on file as of 04/22/2018.     Allergy: No Known Allergies  Social Hx:   Social History   Socioeconomic History  . Marital status: Married    Spouse name: Not on file  . Number of children: Not on file  . Years of education: Not on file  . Highest education level: Not on file  Occupational History  . Not on file  Social Needs  . Financial resource strain: Not on file  . Food insecurity:    Worry: Not on file    Inability: Not on file  . Transportation needs:    Medical: Not on file    Non-medical: Not on file  Tobacco Use  . Smoking status: Never Smoker  . Smokeless tobacco: Never Used  Substance and Sexual Activity  . Alcohol use: Yes    Comment: social only  . Drug use: No  . Sexual activity: Yes  Lifestyle  . Physical activity:    Days per week: Not on file    Minutes per session: Not on file  . Stress: Not on file  Relationships  . Social connections:    Talks on phone: Not on file    Gets together: Not on file    Attends religious service: Not on file    Active member of club or organization: Not on file    Attends meetings of clubs or organizations: Not on file    Relationship status: Not on file  . Intimate partner violence:    Fear of current or ex partner: Not on file    Emotionally abused: Not on file    Physically abused: Not on file    Forced sexual activity: Not on file  Other Topics Concern  . 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 CATARACT EXTRACTION W/ INTRAOCULAR LENS  IMPLANT, BILATERAL  2011  . COLONOSCOPY WITH PROPOFOL N/A 09/14/2013   Procedure: COLONOSCOPY WITH PROPOFOL;  Surgeon: Garlan Fair, MD;  Location: [facility];  Service: Endoscopy;  Laterality: N/A;  . DILATATION & CURRETTAGE/HYSTEROSCOPY WITH RESECTOCOPE N/A  01/29/2018   Procedure: DILATATION & CURETTAGE/HYSTEROSCOPY WITH RESECTOCOPE;  Surgeon: Eldred Manges, MD;  Location: [facility];  Service: Gynecology;  Laterality: N/A;  . LYMPH NODE BIOPSY N/A 03/23/2018   Procedure: SENTINEL LYMPH NODE BIOPSY;  Surgeon: Everitt Amber, MD;  Location: [facility];  Service: Gynecology;  Laterality: N/A;  . ROBOTIC ASSISTED TOTAL HYSTERECTOMY WITH BILATERAL SALPINGO OOPHERECTOMY Bilateral 03/23/2018   Procedure: XI ROBOTIC ASSISTED TOTAL LAPAROSCOPIC  HYSTERECTOMY WITH BILATERAL SALPINGO OOPHORECTOMY;  Surgeon: Everitt Amber, MD;  Location: [facility];  Service: Gynecology;  Laterality: Bilateral;  . VULVA /PERINEUM BIOPSY N/A 01/29/2018   Procedure: VULVAR BIOPSY;  Surgeon: Eldred Manges, MD;  Location: [facility];  Service: Gynecology;  Laterality: N/A;  . WISDOM TOOTH EXTRACTION      Past Medical Hx:  Past Medical History:  Diagnosis Date  . GERD (gastroesophageal reflux disease)   . Hypertension   . PONV (postoperative nausea and vomiting)   . Type 2 diabetes mellitus ([place])    followed by pcp, dr Lynnda Child    Past Gynecological History:  SVD x 3 No LMP recorded. Patient is postmenopausal.  Family Hx:  Family History  Problem Relation Age of Onset  . Heart disease Mother   . Lung cancer Father   . Diabetes Brother     Review of Systems:  Constitutional  Feels well,    ENT Normal appearing ears and nares bilaterally Skin/Breast  No rash, sores, jaundice, itching, dryness Cardiovascular  No chest pain, shortness of breath, or edema  Pulmonary  No cough or wheeze.  Gastro Intestinal  No nausea, vomitting, or diarrhoea. No bright red blood per rectum, no abdominal pain, change in bowel movement, or constipation.  Genito Urinary  No frequency, urgency, dysuria, + bleeding Musculo Skeletal  No myalgia, arthralgia, joint swelling or pain  Neurologic  No weakness, numbness, change in gait,  Psychology  No  depression, anxiety, insomnia.   Vitals:  Blood pressure (!) 134/49, pulse 70, temperature 98.3 F (36.8 C), temperature source Oral, resp. rate 20, height '5\' 2"'  (1.575 m), weight 144 lb 12.8 oz (65.7 kg), SpO2 98 %.  Physical Exam: WD in NAD Neck  Supple NROM, without any enlargements.  Lymph Node Survey No cervical supraclavicular or inguinal adenopathy Cardiovascular  Pulse normal rate, regularity and rhythm. S1 and S2 normal.  Lungs  Clear to auscultation bilateraly, without wheezes/crackles/rhonchi. Good air movement.  Skin  No rash/lesions/breakdown  Psychiatry  Alert and oriented to person, place, and time  Abdomen  Normoactive bowel sounds, abdomen soft, non-tender and nonobese without evidence of hernia. Well healed incisions.  Back No CVA tenderness Genito Urinary  Vaginal cuff in tact with cruciate incision, but healing well. Some blood at stitch line. No cellulitis or active bleeding.  Rectal  deferred Extremities  No bilateral cyanosis, clubbing or edema.   30 minutes of direct face to face counseling time was spent with the patient. This included discussion about prognosis, therapy recommendations and postoperative side effects and are beyond the scope of routine postoperative care.   Thereasa Solo, MD  04/22/2018, 12:27 PM

## 2018-04-22 NOTE — Patient Instructions (Signed)
Please notify Dr Denman George at phone number [phone] if you notice vaginal bleeding, new pelvic or abdominal pains, bloating, feeling full easy, or a change in bladder or bowel function.   Please use vaginal premarin at night before bed 3 times per week. Use this for 1 month.  Please return to see Dr Denman George in December.

## 2018-04-28 ENCOUNTER — Telehealth: Payer: Self-pay

## 2018-04-28 NOTE — Telephone Encounter (Signed)
Incoming call from patient in regards to recent prescription from Dr Denman George for Premarin vaginal cream. Pt reports it is $400, generic $300, and pill form is $160.  Pt asking about any alternatives that cost less, she has Medicare. Let her know I would notify Melissa NP and/or Dr Denman George and will call her back.  Pt voiced understanding.

## 2018-04-28 NOTE — Telephone Encounter (Signed)
Outgoing call to patient pertaining to my last telephone note, no answer.  Left pt VM, per Joylene John NP, she has spoke with Dr Denman George and per Dr Denman George, no need to use cream.  Left our contact info if she has further questions.

## 2018-06-04 DIAGNOSIS — Z7984 Long term (current) use of oral hypoglycemic drugs: Secondary | ICD-10-CM | POA: Diagnosis not present

## 2018-06-04 DIAGNOSIS — B029 Zoster without complications: Secondary | ICD-10-CM | POA: Diagnosis not present

## 2018-06-04 DIAGNOSIS — E039 Hypothyroidism, unspecified: Secondary | ICD-10-CM | POA: Diagnosis not present

## 2018-06-04 DIAGNOSIS — C541 Malignant neoplasm of endometrium: Secondary | ICD-10-CM | POA: Diagnosis not present

## 2018-06-04 DIAGNOSIS — E782 Mixed hyperlipidemia: Secondary | ICD-10-CM | POA: Diagnosis not present

## 2018-06-04 DIAGNOSIS — Z0000 Encounter for general adult medical examination without abnormal findings: Secondary | ICD-10-CM | POA: Diagnosis not present

## 2018-06-04 DIAGNOSIS — E113599 Type 2 diabetes mellitus with proliferative diabetic retinopathy without macular edema, unspecified eye: Secondary | ICD-10-CM | POA: Diagnosis not present

## 2018-06-04 DIAGNOSIS — K219 Gastro-esophageal reflux disease without esophagitis: Secondary | ICD-10-CM | POA: Diagnosis not present

## 2018-06-04 DIAGNOSIS — I10 Essential (primary) hypertension: Secondary | ICD-10-CM | POA: Diagnosis not present

## 2018-07-17 DIAGNOSIS — E039 Hypothyroidism, unspecified: Secondary | ICD-10-CM | POA: Diagnosis not present

## 2018-08-11 DIAGNOSIS — Z23 Encounter for immunization: Secondary | ICD-10-CM | POA: Diagnosis not present

## 2018-09-15 DIAGNOSIS — E039 Hypothyroidism, unspecified: Secondary | ICD-10-CM | POA: Diagnosis not present

## 2018-10-23 ENCOUNTER — Encounter: Payer: Self-pay | Admitting: Gynecologic Oncology

## 2018-10-23 ENCOUNTER — Inpatient Hospital Stay: Payer: Medicare Other | Attending: Gynecologic Oncology | Admitting: Gynecologic Oncology

## 2018-10-23 VITALS — BP 137/61 | HR 73 | Temp 97.8°F | Resp 18

## 2018-10-23 DIAGNOSIS — C541 Malignant neoplasm of endometrium: Secondary | ICD-10-CM | POA: Diagnosis not present

## 2018-10-23 DIAGNOSIS — Z90710 Acquired absence of both cervix and uterus: Secondary | ICD-10-CM

## 2018-10-23 DIAGNOSIS — Z90722 Acquired absence of ovaries, bilateral: Secondary | ICD-10-CM | POA: Diagnosis not present

## 2018-10-23 NOTE — Progress Notes (Signed)
Follow-up Note: Gyn-Onc  Consult was requested by Dr. Leo Grosser for the evaluation of Kiara Kelley 67 y.o. female  CC:  Chief Complaint  Patient presents with  . Endometrial cancer [facility])    Assessment/Plan:  Kiara Kelley  is a 67 y.o.  year old with stage IA grade 1 endometrial cancer (MSI stable, MMR normal).  Pathology revealed low risk factors for recurrence, therefore no adjuvant therapy is recommended according to NCCN guidelines.  I discussed risk for recurrence and typical symptoms encouraged her to notify us of these should they develop between visits.  I recommend she have follow-up every 6 months for 5 years in accordance with NCCN guidelines. Those visits should include symptom assessment, physical exam and pelvic examination. Pap smears are not indicated or recommended in the routine surveillance of endometrial cancer.  She will follow-up with me in 6 months and Dr Leo Grosser in 12 months.   HPI: Kiara Kelley is a very pleasant 67 year old P3 who is seen in consultation at the request of Dr Leo Grosser for grade 1 endometrial cancer.  Patient reports having an episode of vaginal bleeding in mid March 2019.  She was immediately seen by her gynecologist, Dr. Biagio Quint, who promptly performed a transvaginal ultrasound scan which revealed a slightly enlarged uterus measuring approximately 10 cm with some intramural fibroids seen the largest of which was 3.8 cm.  The endometrial thickness was 11.6.  She was then scheduled for a hysteroscopy D&C and vulvar biopsy of pigmented vulvar lesions which took place on January 29, 2018.  The vulvar biopsy revealed lichen sclerosis however the endometrial curettage revealed a FIGO grade 1 endometrioid adenocarcinoma.  Kiara Kelley has a history of type 2 diabetes from young age.  She takes metformin for this and glimepiride.  She has had no prior abdominal surgeries.  Her family history is significant only for a father with lung cancer.  Diabetes runs  strongly in her family.  Interval Hx:  On 03/23/18 she underwent robotic assisted total hysterectomy, BSO and SLN biopsy. Final pathology showed a grade 1 endometrioid endometrial cancer with no myo invasion and negative nodes. She was staged as stage IA grade 1 carcinoma with low risk factors for recurrence and therefore no adjuvant therapy was prescribed.   Premarin was prescribed vaginally postop as she had delayed cuff healing. She has no complaints or concerns.   Current Meds:  Outpatient Encounter Medications as of 10/23/2018  Medication Sig  . Cholecalciferol (VITAMIN D) 2000 units tablet Take 2,000 Units by mouth daily.  . clobetasol ointment (TEMOVATE) 9.14 % Apply 1 application topically 2 (two) times daily as needed (skin irritation).  . conjugated estrogens (PREMARIN) vaginal cream Place 1 Applicatorful vaginally 3 (three) times a week. Take at night before bed, 3 times a week for 1 month  . glimepiride (AMARYL) 4 MG tablet Take 4 mg by mouth daily with breakfast.  . ibuprofen (ADVIL,MOTRIN) 800 MG tablet Take 1 tablet (800 mg total) by mouth every 8 (eight) hours as needed.  Marland Kitchen levothyroxine (SYNTHROID, LEVOTHROID) 25 MCG tablet Take 25 mcg by mouth daily before breakfast. Takes 1 and a half daily (37.5)  . lisinopril (PRINIVIL,ZESTRIL) 20 MG tablet Take 20 mg by mouth daily.   . metFORMIN (GLUCOPHAGE) 1000 MG tablet Take 1,000 mg by mouth 2 (two) times daily with a meal.  . Multiple Vitamins-Minerals (MULTIVITAMIN WITH MINERALS) tablet Take 1 tablet by mouth daily.  . Omega-3 Fatty Acids (FISH OIL) 1200 MG CAPS  Take 1 capsule by mouth daily.   Marland Kitchen senna (SENOKOT) 8.6 MG TABS tablet Take 1 tablet (8.6 mg total) by mouth at bedtime.  . simvastatin (ZOCOR) 40 MG tablet Take 40 mg by mouth daily at 6 PM.   No facility-administered encounter medications on file as of 10/23/2018.     Allergy: No Known Allergies  Social Hx:   Social History   Socioeconomic History  . Marital  status: Married    Spouse name: Not on file  . Number of children: Not on file  . Years of education: Not on file  . Highest education level: Not on file  Occupational History  . Not on file  Social Needs  . Financial resource strain: Not on file  . Food insecurity:    Worry: Not on file    Inability: Not on file  . Transportation needs:    Medical: Not on file    Non-medical: Not on file  Tobacco Use  . Smoking status: Never Smoker  . Smokeless tobacco: Never Used  Substance and Sexual Activity  . Alcohol use: Yes    Comment: social only  . Drug use: No  . Sexual activity: Yes  Lifestyle  . Physical activity:    Days per week: Not on file    Minutes per session: Not on file  . Stress: Not on file  Relationships  . Social connections:    Talks on phone: Not on file    Gets together: Not on file    Attends religious service: Not on file    Active member of club or organization: Not on file    Attends meetings of clubs or organizations: Not on file    Relationship status: Not on file  . Intimate partner violence:    Fear of current or ex partner: Not on file    Emotionally abused: Not on file    Physically abused: Not on file    Forced sexual activity: Not on file  Other Topics Concern  . 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 CATARACT EXTRACTION W/ INTRAOCULAR LENS  IMPLANT, BILATERAL  2011  . COLONOSCOPY WITH PROPOFOL N/A 09/14/2013   Procedure: COLONOSCOPY WITH PROPOFOL;  Surgeon: Garlan Fair, MD;  [facility]: [facility];  Service: Endoscopy;  Laterality: N/A;  . DILATATION & CURRETTAGE/HYSTEROSCOPY WITH RESECTOCOPE N/A 01/29/2018   Procedure: DILATATION & CURETTAGE/HYSTEROSCOPY WITH RESECTOCOPE;  Surgeon: Eldred Manges, MD;  Location: [facility];  Service: Gynecology;  Laterality: N/A;  . LYMPH NODE BIOPSY N/A 03/23/2018    Procedure: SENTINEL LYMPH NODE BIOPSY;  Surgeon: Everitt Amber, MD;  [facility];  Service: Gynecology;  Laterality: N/A;  . ROBOTIC ASSISTED TOTAL HYSTERECTOMY WITH BILATERAL SALPINGO OOPHERECTOMY Bilateral 03/23/2018   Procedure: XI ROBOTIC ASSISTED TOTAL LAPAROSCOPIC  HYSTERECTOMY WITH BILATERAL SALPINGO OOPHORECTOMY;  Surgeon: Everitt Amber, MD;  Location[facility];  Service: Gynecology;  Laterality: Bilateral;  . VULVA /PERINEUM BIOPSY N/A 01/29/2018   Procedure: VULVAR BIOPSY;  Surgeon: Eldred Manges, MD;  Location: [facility];  Service: Gynecology;  Laterality: N/A;  . WISDOM TOOTH EXTRACTION      Past Medical Hx:  Past Medical History:  Diagnosis Date  . GERD (gastroesophageal reflux disease)   . Hypertension   . PONV (postoperative nausea and vomiting)   . Thyroid disease   . Type  2 diabetes mellitus ([place])    followed by pcp, dr Lynnda Child    Past Gynecological History:  SVD x 3 No LMP recorded. Patient is postmenopausal.  Family Hx:  Family History  Problem Relation Age of Onset  . Heart disease Mother   . Lung cancer Father   . Diabetes Brother     Review of Systems:  Constitutional  Feels well,    ENT Normal appearing ears and nares bilaterally Skin/Breast  No rash, sores, jaundice, itching, dryness Cardiovascular  No chest pain, shortness of breath, or edema  Pulmonary  No cough or wheeze.  Gastro Intestinal  No nausea, vomitting, or diarrhoea. No bright red blood per rectum, no abdominal pain, change in bowel movement, or constipation.  Genito Urinary  No frequency, urgency, dysuria, no bleeding Musculo Skeletal  No myalgia, arthralgia, joint swelling or pain  Neurologic  No weakness, numbness, change in gait,  Psychology  No depression, anxiety, insomnia.   Vitals:  Blood pressure 137/61, pulse 73, temperature 97.8 F (36.6 C), temperature source Oral, resp. rate 18, SpO2 100 %.  Physical Exam: WD in NAD Neck  Supple NROM,  without any enlargements.  Lymph Node Survey No cervical supraclavicular or inguinal adenopathy Cardiovascular  Pulse normal rate, regularity and rhythm. S1 and S2 normal.  Lungs  Clear to auscultation bilateraly, without wheezes/crackles/rhonchi. Good air movement.  Skin  No rash/lesions/breakdown  Psychiatry  Alert and oriented to person, place, and time  Abdomen  Normoactive bowel sounds, abdomen soft, non-tender and nonobese without evidence of hernia. Well healed incisions.  Back No CVA tenderness Genito Urinary  Vaginal cuff in tact, well healed, no lesions or palpable masses + prolapse (cystocele).  Rectal  deferred Extremities  No bilateral cyanosis, clubbing or edema.    Thereasa Solo, MD  10/23/2018, 3:59 PM

## 2018-10-23 NOTE — Patient Instructions (Signed)
Please notify Dr Denman George at phone number [phone] if you notice vaginal bleeding, new pelvic or abdominal pains, bloating, feeling full easy, or a change in bladder or bowel function.   Please return to see Dr Leo Grosser in June, 2020 and then contact Dr Serita Grit office at [phone] to schedule a follow-up with her in December, 2020.  It is reasonable to discontinue the vaginal premarin as the tissues have fully healed.

## 2018-11-13 DIAGNOSIS — E039 Hypothyroidism, unspecified: Secondary | ICD-10-CM | POA: Diagnosis not present

## 2018-12-11 DIAGNOSIS — R0981 Nasal congestion: Secondary | ICD-10-CM | POA: Diagnosis not present

## 2018-12-11 DIAGNOSIS — R05 Cough: Secondary | ICD-10-CM | POA: Diagnosis not present

## 2018-12-11 DIAGNOSIS — E039 Hypothyroidism, unspecified: Secondary | ICD-10-CM | POA: Diagnosis not present

## 2018-12-11 DIAGNOSIS — C541 Malignant neoplasm of endometrium: Secondary | ICD-10-CM | POA: Diagnosis not present

## 2018-12-11 DIAGNOSIS — E782 Mixed hyperlipidemia: Secondary | ICD-10-CM | POA: Diagnosis not present

## 2018-12-11 DIAGNOSIS — Z7984 Long term (current) use of oral hypoglycemic drugs: Secondary | ICD-10-CM | POA: Diagnosis not present

## 2018-12-11 DIAGNOSIS — I10 Essential (primary) hypertension: Secondary | ICD-10-CM | POA: Diagnosis not present

## 2018-12-11 DIAGNOSIS — E113592 Type 2 diabetes mellitus with proliferative diabetic retinopathy without macular edema, left eye: Secondary | ICD-10-CM | POA: Diagnosis not present

## 2019-04-22 DIAGNOSIS — Z9189 Other specified personal risk factors, not elsewhere classified: Secondary | ICD-10-CM | POA: Diagnosis not present

## 2019-04-22 DIAGNOSIS — Z8542 Personal history of malignant neoplasm of other parts of uterus: Secondary | ICD-10-CM | POA: Diagnosis not present

## 2019-04-23 ENCOUNTER — Other Ambulatory Visit: Payer: Self-pay | Admitting: Obstetrics & Gynecology

## 2019-04-23 DIAGNOSIS — Z1231 Encounter for screening mammogram for malignant neoplasm of breast: Secondary | ICD-10-CM

## 2019-06-08 ENCOUNTER — Ambulatory Visit
Admission: RE | Admit: 2019-06-08 | Discharge: 2019-06-08 | Disposition: A | Discharge status: Home / Self Care | Payer: Medicare Other | Source: Ambulatory Visit | Attending: Obstetrics & Gynecology | Admitting: Obstetrics & Gynecology

## 2019-06-08 ENCOUNTER — Other Ambulatory Visit: Payer: Self-pay

## 2019-06-08 DIAGNOSIS — Z1231 Encounter for screening mammogram for malignant neoplasm of breast: Secondary | ICD-10-CM | POA: Diagnosis not present

## 2019-08-01 DIAGNOSIS — Z23 Encounter for immunization: Secondary | ICD-10-CM | POA: Diagnosis not present

## 2019-08-03 DIAGNOSIS — Z7984 Long term (current) use of oral hypoglycemic drugs: Secondary | ICD-10-CM | POA: Diagnosis not present

## 2019-08-03 DIAGNOSIS — E113592 Type 2 diabetes mellitus with proliferative diabetic retinopathy without macular edema, left eye: Secondary | ICD-10-CM | POA: Diagnosis not present

## 2019-08-03 DIAGNOSIS — E039 Hypothyroidism, unspecified: Secondary | ICD-10-CM | POA: Diagnosis not present

## 2019-08-03 DIAGNOSIS — E782 Mixed hyperlipidemia: Secondary | ICD-10-CM | POA: Diagnosis not present

## 2019-08-06 DIAGNOSIS — Z0000 Encounter for general adult medical examination without abnormal findings: Secondary | ICD-10-CM | POA: Diagnosis not present

## 2019-08-06 DIAGNOSIS — E113592 Type 2 diabetes mellitus with proliferative diabetic retinopathy without macular edema, left eye: Secondary | ICD-10-CM | POA: Diagnosis not present

## 2019-08-06 DIAGNOSIS — Z8542 Personal history of malignant neoplasm of other parts of uterus: Secondary | ICD-10-CM | POA: Diagnosis not present

## 2019-08-06 DIAGNOSIS — I10 Essential (primary) hypertension: Secondary | ICD-10-CM | POA: Diagnosis not present

## 2019-08-06 DIAGNOSIS — K219 Gastro-esophageal reflux disease without esophagitis: Secondary | ICD-10-CM | POA: Diagnosis not present

## 2019-08-06 DIAGNOSIS — F39 Unspecified mood [affective] disorder: Secondary | ICD-10-CM | POA: Diagnosis not present

## 2019-08-06 DIAGNOSIS — E782 Mixed hyperlipidemia: Secondary | ICD-10-CM | POA: Diagnosis not present

## 2019-08-06 DIAGNOSIS — E039 Hypothyroidism, unspecified: Secondary | ICD-10-CM | POA: Diagnosis not present

## 2019-08-06 DIAGNOSIS — M6530 Trigger finger, unspecified finger: Secondary | ICD-10-CM | POA: Diagnosis not present

## 2019-08-06 DIAGNOSIS — Z7984 Long term (current) use of oral hypoglycemic drugs: Secondary | ICD-10-CM | POA: Diagnosis not present

## 2019-08-06 DIAGNOSIS — Z7189 Other specified counseling: Secondary | ICD-10-CM | POA: Diagnosis not present

## 2019-08-17 DIAGNOSIS — M65331 Trigger finger, right middle finger: Secondary | ICD-10-CM | POA: Diagnosis not present

## 2019-08-17 DIAGNOSIS — M79644 Pain in right finger(s): Secondary | ICD-10-CM | POA: Diagnosis not present

## 2019-09-06 ENCOUNTER — Telehealth: Payer: Self-pay | Admitting: *Deleted

## 2019-09-06 NOTE — Telephone Encounter (Signed)
Patient called and scheduled the follow up appt for December

## 2019-10-01 DIAGNOSIS — E039 Hypothyroidism, unspecified: Secondary | ICD-10-CM | POA: Diagnosis not present

## 2019-10-06 ENCOUNTER — Other Ambulatory Visit: Payer: Self-pay

## 2019-10-06 ENCOUNTER — Encounter: Payer: Self-pay | Admitting: Gynecologic Oncology

## 2019-10-06 ENCOUNTER — Inpatient Hospital Stay: Payer: Medicare Other | Attending: Gynecologic Oncology | Admitting: Gynecologic Oncology

## 2019-10-06 VITALS — BP 131/50 | HR 74 | Temp 97.8°F | Resp 18 | Ht 65.0 in | Wt 142.4 lb

## 2019-10-06 DIAGNOSIS — K219 Gastro-esophageal reflux disease without esophagitis: Secondary | ICD-10-CM | POA: Diagnosis not present

## 2019-10-06 DIAGNOSIS — Z79899 Other long term (current) drug therapy: Secondary | ICD-10-CM | POA: Insufficient documentation

## 2019-10-06 DIAGNOSIS — Z90722 Acquired absence of ovaries, bilateral: Secondary | ICD-10-CM | POA: Insufficient documentation

## 2019-10-06 DIAGNOSIS — E119 Type 2 diabetes mellitus without complications: Secondary | ICD-10-CM | POA: Diagnosis not present

## 2019-10-06 DIAGNOSIS — C541 Malignant neoplasm of endometrium: Secondary | ICD-10-CM | POA: Diagnosis not present

## 2019-10-06 DIAGNOSIS — Z7984 Long term (current) use of oral hypoglycemic drugs: Secondary | ICD-10-CM | POA: Diagnosis not present

## 2019-10-06 DIAGNOSIS — I10 Essential (primary) hypertension: Secondary | ICD-10-CM | POA: Insufficient documentation

## 2019-10-06 DIAGNOSIS — Z90710 Acquired absence of both cervix and uterus: Secondary | ICD-10-CM | POA: Insufficient documentation

## 2019-10-06 NOTE — Progress Notes (Signed)
Follow-up Note: Gyn-Onc  Consult was initially requested by Dr. Leo Grosser for the evaluation of Kiara Kelley 68 y.o. female  CC:  Chief Complaint  Patient presents with  . Endometrial cancer [facility])    Assessment/Plan:  Kiara Kelley  is a 68 y.o.  year old with stage IA grade 1 endometrial cancer (MSI stable, MMR normal) s/p staging on 03/23/2018.  Pathology revealed low risk factors for recurrence, therefore no adjuvant therapy was recommended according to NCCN guidelines.  I discussed risk for recurrence and typical symptoms encouraged her to notify us of these should they develop between visits.  I recommend she have follow-up every 6 months for 5 years in accordance with NCCN guidelines. Those visits should include symptom assessment, physical exam and pelvic examination. Pap smears are not indicated or recommended in the routine surveillance of endometrial cancer.  She will follow-up with Dr Nelda Marseille in 6 months and myself in 12 months.   HPI: Kiara Kelley is a very pleasant 68 year old P3 who is seen in consultation at the request of Dr Leo Grosser for grade 1 endometrial cancer.  Patient reports having an episode of vaginal bleeding in mid March 2019.  She was immediately seen by her gynecologist, Dr. Biagio Quint, who promptly performed a transvaginal ultrasound scan which revealed a slightly enlarged uterus measuring approximately 10 cm with some intramural fibroids seen the largest of which was 3.8 cm.  The endometrial thickness was 11.6.  She was then scheduled for a hysteroscopy D&C and vulvar biopsy of pigmented vulvar lesions which took place on January 29, 2018.  The vulvar biopsy revealed lichen sclerosis however the endometrial curettage revealed a FIGO grade 1 endometrioid adenocarcinoma.  Kiara Kelley has a history of type 2 diabetes from young age.  She takes metformin for this and glimepiride.  She has had no prior abdominal surgeries.  Her family history is significant only for a  father with lung cancer.  Diabetes runs strongly in her family.  Interval Hx:  On 03/23/18 she underwent robotic assisted total hysterectomy, BSO and SLN biopsy. Final pathology showed a grade 1 endometrioid endometrial cancer with no myo invasion and negative nodes. She was staged as stage IA grade 1 carcinoma with low risk factors for recurrence and therefore no adjuvant therapy was prescribed.   She has no complaints or concerns.   Current Meds:  Outpatient Encounter Medications as of 10/06/2019  Medication Sig  . sertraline (ZOLOFT) 25 MG tablet Take 25 mg by mouth daily.  . Cholecalciferol (VITAMIN D) 2000 units tablet Take 2,000 Units by mouth daily.  . clobetasol ointment (TEMOVATE) 3.53 % Apply 1 application topically 2 (two) times daily as needed (skin irritation).  Marland Kitchen glimepiride (AMARYL) 4 MG tablet Take 4 mg by mouth daily with breakfast.  . ibuprofen (ADVIL,MOTRIN) 800 MG tablet Take 1 tablet (800 mg total) by mouth every 8 (eight) hours as needed.  Marland Kitchen levothyroxine (SYNTHROID, LEVOTHROID) 25 MCG tablet Take 25 mcg by mouth daily before breakfast. Takes 1 and a half daily (37.5)  . lisinopril (PRINIVIL,ZESTRIL) 20 MG tablet Take 20 mg by mouth daily.   . metFORMIN (GLUCOPHAGE) 1000 MG tablet Take 1,000 mg by mouth 2 (two) times daily with a meal.  . Multiple Vitamins-Minerals (MULTIVITAMIN WITH MINERALS) tablet Take 1 tablet by mouth daily.  . Omega-3 Fatty Acids (FISH OIL) 1200 MG CAPS Take 1 capsule by mouth daily.   . simvastatin (ZOCOR) 40 MG tablet Take 40 mg by mouth daily at  6 PM.  . [DISCONTINUED] conjugated estrogens (PREMARIN) vaginal cream Place 1 Applicatorful vaginally 3 (three) times a week. Take at night before bed, 3 times a week for 1 month  . [DISCONTINUED] senna (SENOKOT) 8.6 MG TABS tablet Take 1 tablet (8.6 mg total) by mouth at bedtime.   No facility-administered encounter medications on file as of 10/06/2019.     Allergy: No Known Allergies  Social Hx:    Social History   Socioeconomic History  . Marital status: Married    Spouse name: Not on file  . Number of children: Not on file  . Years of education: Not on file  . Highest education level: Not on file  Occupational History  . Not on file  Social Needs  . Financial resource strain: Not on file  . Food insecurity    Worry: Not on file    Inability: Not on file  . Transportation needs    Medical: Not on file    Non-medical: Not on file  Tobacco Use  . Smoking status: Never Smoker  . Smokeless tobacco: Never Used  Substance and Sexual Activity  . Alcohol use: Yes    Comment: social only  . Drug use: No  . Sexual activity: Yes  Lifestyle  . Physical activity    Days per week: Not on file    Minutes per session: Not on file  . Stress: Not on file  Relationships  . Social Herbalist on phone: Not on file    Gets together: Not on file    Attends religious service: Not on file    Active member of club or organization: Not on file    Attends meetings of clubs or organizations: Not on file    Relationship status: Not on file  . Intimate partner violence    Fear of current or ex partner: Not on file    Emotionally abused: Not on file    Physically abused: Not on file    Forced sexual activity: Not on file  Other Topics Concern  . 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 CATARACT EXTRACTION W/ INTRAOCULAR LENS  IMPLANT, BILATERAL  2011  . COLONOSCOPY WITH PROPOFOL N/A 09/14/2013   Procedure: COLONOSCOPY WITH PROPOFOL;  Surgeon: Garlan Fair, MD;  Location: [facility];  Service: Endoscopy;  Laterality: N/A;  . DILATATION & CURRETTAGE/HYSTEROSCOPY WITH RESECTOCOPE N/A 01/29/2018   Procedure: DILATATION & CURETTAGE/HYSTEROSCOPY WITH RESECTOCOPE;  Surgeon: Eldred Manges, MD;  Location: [place];  Service: Gynecology;   Laterality: N/A;  . LYMPH NODE BIOPSY N/A 03/23/2018   Procedure: SENTINEL LYMPH NODE BIOPSY;  Surgeon: Everitt Amber, MD;  Location: [facility];  Service: Gynecology;  Laterality: N/A;  . ROBOTIC ASSISTED TOTAL HYSTERECTOMY WITH BILATERAL SALPINGO OOPHERECTOMY Bilateral 03/23/2018   Procedure: XI ROBOTIC ASSISTED TOTAL LAPAROSCOPIC  HYSTERECTOMY WITH BILATERAL SALPINGO OOPHORECTOMY;  Surgeon: Everitt Amber, MD;  Location: [facility];  Service: Gynecology;  Laterality: Bilateral;  . VULVA /PERINEUM BIOPSY N/A 01/29/2018   Procedure: VULVAR BIOPSY;  Surgeon: Eldred Manges, MD;  Location: [facility];  Service: Gynecology;  Laterality: N/A;  . WISDOM TOOTH EXTRACTION      Past Medical Hx:  Past Medical History:  Diagnosis Date  . GERD (gastroesophageal reflux disease)   . Hypertension   . PONV (postoperative nausea and vomiting)   .  Thyroid disease   . Type 2 diabetes mellitus ([place])    followed by pcp, dr Lynnda Child    Past Gynecological History:  SVD x 3 No LMP recorded. Patient is postmenopausal.  Family Hx:  Family History  Problem Relation Age of Onset  . Heart disease Mother   . Lung cancer Father   . Diabetes Brother     Review of Systems:  Constitutional  Feels well,    ENT Normal appearing ears and nares bilaterally Skin/Breast  No rash, sores, jaundice, itching, dryness Cardiovascular  No chest pain, shortness of breath, or edema  Pulmonary  No cough or wheeze.  Gastro Intestinal  No nausea, vomitting, or diarrhoea. No bright red blood per rectum, no abdominal pain, change in bowel movement, or constipation.  Genito Urinary  No frequency, urgency, dysuria, no bleeding Musculo Skeletal  No myalgia, arthralgia, joint swelling or pain  Neurologic  No weakness, numbness, change in gait,  Psychology  No depression, anxiety, insomnia.   Vitals:  Blood pressure (!) 131/50, pulse 74, temperature 97.8 F (36.6 C), temperature source Temporal, resp. rate 18,  height '5\' 5"'  (1.651 m), weight 142 lb 6 oz (64.6 kg), SpO2 100 %.  Physical Exam: WD in NAD Neck  Supple NROM, without any enlargements.  Lymph Node Survey No cervical supraclavicular or inguinal adenopathy Cardiovascular  Pulse normal rate, regularity and rhythm. S1 and S2 normal.  Lungs  Clear to auscultation bilateraly, without wheezes/crackles/rhonchi. Good air movement.  Skin  No rash/lesions/breakdown  Psychiatry  Alert and oriented to person, place, and time  Abdomen  Normoactive bowel sounds, abdomen soft, non-tender and nonobese without evidence of hernia. Well healed incisions.  Back No CVA tenderness Genito Urinary  Vaginal cuff in tact, well healed, no lesions or palpable masses + prolapse (cystocele).  Rectal  deferred Extremities  No bilateral cyanosis, clubbing or edema.    Kiara Solo, MD  10/06/2019, 1:53 PM

## 2019-10-06 NOTE — Patient Instructions (Signed)
Please notify Dr Denman George at phone number [phone] if you notice vaginal bleeding, new pelvic or abdominal pains, bloating, feeling full easy, or a change in bladder or bowel function.   Please contact Dr Serita Grit office (at [phone]) in July, 2021 to request an appointment with her for November, 2021.

## 2020-02-15 DIAGNOSIS — E039 Hypothyroidism, unspecified: Secondary | ICD-10-CM | POA: Diagnosis not present

## 2020-02-15 DIAGNOSIS — I10 Essential (primary) hypertension: Secondary | ICD-10-CM | POA: Diagnosis not present

## 2020-02-15 DIAGNOSIS — E1169 Type 2 diabetes mellitus with other specified complication: Secondary | ICD-10-CM | POA: Diagnosis not present

## 2020-02-15 DIAGNOSIS — E782 Mixed hyperlipidemia: Secondary | ICD-10-CM | POA: Diagnosis not present

## 2020-04-24 ENCOUNTER — Other Ambulatory Visit: Payer: Self-pay | Admitting: Obstetrics & Gynecology

## 2020-04-24 DIAGNOSIS — Z1231 Encounter for screening mammogram for malignant neoplasm of breast: Secondary | ICD-10-CM

## 2020-06-09 ENCOUNTER — Ambulatory Visit
Admission: RE | Admit: 2020-06-09 | Discharge: 2020-06-09 | Disposition: A | Discharge status: Home / Self Care | Payer: Medicare Other | Source: Ambulatory Visit | Attending: Obstetrics & Gynecology | Admitting: Obstetrics & Gynecology

## 2020-06-09 ENCOUNTER — Other Ambulatory Visit: Payer: Self-pay

## 2020-06-09 DIAGNOSIS — Z1231 Encounter for screening mammogram for malignant neoplasm of breast: Secondary | ICD-10-CM | POA: Diagnosis not present

## 2020-08-17 DIAGNOSIS — E782 Mixed hyperlipidemia: Secondary | ICD-10-CM | POA: Diagnosis not present

## 2020-08-17 DIAGNOSIS — E113599 Type 2 diabetes mellitus with proliferative diabetic retinopathy without macular edema, unspecified eye: Secondary | ICD-10-CM | POA: Diagnosis not present

## 2020-08-17 DIAGNOSIS — I10 Essential (primary) hypertension: Secondary | ICD-10-CM | POA: Diagnosis not present

## 2020-08-17 DIAGNOSIS — Z23 Encounter for immunization: Secondary | ICD-10-CM | POA: Diagnosis not present

## 2020-08-17 DIAGNOSIS — E1169 Type 2 diabetes mellitus with other specified complication: Secondary | ICD-10-CM | POA: Diagnosis not present

## 2020-08-17 DIAGNOSIS — Z0000 Encounter for general adult medical examination without abnormal findings: Secondary | ICD-10-CM | POA: Diagnosis not present

## 2020-08-17 DIAGNOSIS — F39 Unspecified mood [affective] disorder: Secondary | ICD-10-CM | POA: Diagnosis not present

## 2020-08-17 DIAGNOSIS — K219 Gastro-esophageal reflux disease without esophagitis: Secondary | ICD-10-CM | POA: Diagnosis not present

## 2020-08-17 DIAGNOSIS — Z8542 Personal history of malignant neoplasm of other parts of uterus: Secondary | ICD-10-CM | POA: Diagnosis not present

## 2020-08-17 DIAGNOSIS — E039 Hypothyroidism, unspecified: Secondary | ICD-10-CM | POA: Diagnosis not present

## 2020-08-23 ENCOUNTER — Other Ambulatory Visit: Payer: Self-pay | Admitting: Family Medicine

## 2020-08-23 DIAGNOSIS — E2839 Other primary ovarian failure: Secondary | ICD-10-CM

## 2020-10-18 ENCOUNTER — Encounter: Payer: Self-pay | Admitting: Gynecologic Oncology

## 2020-10-19 ENCOUNTER — Inpatient Hospital Stay: Payer: Medicare Other | Attending: Gynecologic Oncology | Admitting: Gynecologic Oncology

## 2020-10-19 ENCOUNTER — Encounter: Payer: Self-pay | Admitting: Gynecologic Oncology

## 2020-10-19 ENCOUNTER — Other Ambulatory Visit: Payer: Self-pay

## 2020-10-19 VITALS — BP 118/48 | HR 67 | Temp 97.3°F | Resp 18 | Wt 148.0 lb

## 2020-10-19 DIAGNOSIS — Z90710 Acquired absence of both cervix and uterus: Secondary | ICD-10-CM | POA: Insufficient documentation

## 2020-10-19 DIAGNOSIS — Z7984 Long term (current) use of oral hypoglycemic drugs: Secondary | ICD-10-CM | POA: Insufficient documentation

## 2020-10-19 DIAGNOSIS — E079 Disorder of thyroid, unspecified: Secondary | ICD-10-CM | POA: Diagnosis not present

## 2020-10-19 DIAGNOSIS — Z79899 Other long term (current) drug therapy: Secondary | ICD-10-CM | POA: Insufficient documentation

## 2020-10-19 DIAGNOSIS — K219 Gastro-esophageal reflux disease without esophagitis: Secondary | ICD-10-CM | POA: Insufficient documentation

## 2020-10-19 DIAGNOSIS — I10 Essential (primary) hypertension: Secondary | ICD-10-CM | POA: Diagnosis not present

## 2020-10-19 DIAGNOSIS — Z90722 Acquired absence of ovaries, bilateral: Secondary | ICD-10-CM | POA: Diagnosis not present

## 2020-10-19 DIAGNOSIS — E119 Type 2 diabetes mellitus without complications: Secondary | ICD-10-CM | POA: Insufficient documentation

## 2020-10-19 DIAGNOSIS — C541 Malignant neoplasm of endometrium: Secondary | ICD-10-CM | POA: Insufficient documentation

## 2020-10-19 NOTE — Progress Notes (Signed)
Follow-up Note: Gyn-Onc  Consult was initially requested by Dr. Leo Grosser for the evaluation of Kiara Kelley 69 y.o. female  CC:  Chief Complaint  Patient presents with  . Endometrial cancer [facility])    Assessment/Plan:  Kiara Kelley  is a 69 y.o.  year old with a history of stage IA grade 1 endometrial cancer (MSI stable, MMR normal) s/p staging on 03/23/2018.  Pathology revealed low risk factors for recurrence, therefore no adjuvant therapy was recommended according to NCCN guidelines.  I discussed risk for recurrence and typical symptoms encouraged her to notify us of these should they develop between visits.  I recommend she have follow-up every 6 months for 5 years in accordance with NCCN guidelines. Those visits should include symptom assessment, physical exam and pelvic examination. Pap smears are not indicated or recommended in the routine surveillance of endometrial cancer.  She will follow-up with her gynecologist in 6 months and myself in 12 months.   HPI: Kiara Kelley is a very pleasant 69 year old P3 who is seen in consultation at the request of Dr Leo Grosser for grade 1 endometrial cancer.  Patient reports having an episode of vaginal bleeding in mid March 2019.  She was immediately seen by her gynecologist, Dr. Biagio Quint, who promptly performed a transvaginal ultrasound scan which revealed a slightly enlarged uterus measuring approximately 10 cm with some intramural fibroids seen the largest of which was 3.8 cm.  The endometrial thickness was 11.6.  She was then scheduled for a hysteroscopy D&C and vulvar biopsy of pigmented vulvar lesions which took place on January 29, 2018.  The vulvar biopsy revealed lichen sclerosis however the endometrial curettage revealed a FIGO grade 1 endometrioid adenocarcinoma.    Interval Hx:  On 03/23/18 she underwent robotic assisted total hysterectomy, BSO and SLN biopsy. Final pathology showed a grade 1 endometrioid endometrial cancer with no  myo invasion and negative nodes. She was staged as stage IA grade 1 carcinoma with low risk factors for recurrence and therefore no adjuvant therapy was prescribed.   She is doing well with no complaints.   Current Meds:  Outpatient Encounter Medications as of 10/19/2020  Medication Sig  . Cholecalciferol (VITAMIN D) 2000 units tablet Take 2,000 Units by mouth daily.  . clobetasol ointment (TEMOVATE) 8.11 % Apply 1 application topically 2 (two) times daily as needed (skin irritation).  Marland Kitchen glimepiride (AMARYL) 4 MG tablet Take 4 mg by mouth daily with breakfast.  . ibuprofen (ADVIL,MOTRIN) 800 MG tablet Take 1 tablet (800 mg total) by mouth every 8 (eight) hours as needed.  Marland Kitchen levothyroxine (SYNTHROID) 50 MCG tablet Take 50 mcg by mouth every morning.  Marland Kitchen lisinopril (PRINIVIL,ZESTRIL) 20 MG tablet Take 20 mg by mouth daily.   . metFORMIN (GLUCOPHAGE) 1000 MG tablet Take 1,000 mg by mouth 2 (two) times daily with a meal.  . Multiple Vitamins-Minerals (MULTIVITAMIN WITH MINERALS) tablet Take 1 tablet by mouth daily.  . Omega-3 Fatty Acids (FISH OIL) 1200 MG CAPS Take 1 capsule by mouth daily.   . rosuvastatin (CRESTOR) 40 MG tablet Take 40 mg by mouth daily.  . sertraline (ZOLOFT) 50 MG tablet Take 25 mg by mouth daily.  . [DISCONTINUED] levothyroxine (SYNTHROID, LEVOTHROID) 25 MCG tablet Take 25 mcg by mouth daily before breakfast. Takes 1 and a half daily (37.5)  . [DISCONTINUED] sertraline (ZOLOFT) 25 MG tablet Take 25 mg by mouth daily.  . [DISCONTINUED] simvastatin (ZOCOR) 40 MG tablet Take 40 mg by mouth daily  at 6 PM.   No facility-administered encounter medications on file as of 10/19/2020.    Allergy: No Known Allergies  Social Hx:   Social History   Socioeconomic History  . Marital status: Married    Spouse name: Not on file  . Number of children: Not on file  . Years of education: Not on file  . Highest education level: Not on file  Occupational History  . Not on file   Tobacco Use  . Smoking status: Never Smoker  . Smokeless tobacco: Never Used  Vaping Use  . Vaping Use: Never used  Substance and Sexual Activity  . Alcohol use: Yes    Comment: social only  . Drug use: No  . Sexual activity: Yes  Other Topics Concern  . 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 CATARACT EXTRACTION W/ INTRAOCULAR LENS  IMPLANT, BILATERAL  2011  . COLONOSCOPY WITH PROPOFOL N/A 09/14/2013   Procedure: COLONOSCOPY WITH PROPOFOL;  Surgeon: Garlan Fair, MD;  [facility];  Service: Endoscopy;  Laterality: N/A;  . DILATATION & CURRETTAGE/HYSTEROSCOPY WITH RESECTOCOPE N/A 01/29/2018   Procedure: DILATATION & CURETTAGE/HYSTEROSCOPY WITH RESECTOCOPE;  Surgeon: Eldred Manges, MD;  Location: [place];  Service: Gynecology;  Laterality: N/A;  . LYMPH NODE BIOPSY N/A 03/23/2018   Procedure: SENTINEL LYMPH NODE BIOPSY;  Surgeon: Everitt Amber, MD;  [facility];  Service: Gynecology;  Laterality: N/A;  . ROBOTIC ASSISTED TOTAL HYSTERECTOMY WITH BILATERAL SALPINGO OOPHERECTOMY Bilateral 03/23/2018   Procedure: XI ROBOTIC ASSISTED TOTAL LAPAROSCOPIC  HYSTERECTOMY WITH BILATERAL SALPINGO OOPHORECTOMY;  Surgeon: Everitt Amber, MD;  [facility];  Service: Gynecology;  Laterality: Bilateral;  . VULVA /PERINEUM BIOPSY N/A 01/29/2018   Procedure: VULVAR BIOPSY;  Surgeon: Eldred Manges, MD;  Location: [facility];  Service: Gynecology;  Laterality: N/A;  . WISDOM TOOTH EXTRACTION      Past Medical Hx:  Past Medical History:  Diagnosis Date  . GERD  (gastroesophageal reflux disease)   . Hypertension   . PONV (postoperative nausea and vomiting)   . Thyroid disease   . Type 2 diabetes mellitus ([place])    followed by pcp, dr Lynnda Child    Past Gynecological History:  SVD x 3 No LMP recorded. Patient is postmenopausal.  Family Hx:  Family History  Problem Relation Age of Onset  . Heart disease Mother   . Lung cancer Father   . Diabetes Brother     Review of Systems:  Constitutional  Feels well,    ENT Normal appearing ears and nares bilaterally Skin/Breast  No rash, sores, jaundice, itching, dryness Cardiovascular  No chest pain, shortness of breath, or edema  Pulmonary  No cough or wheeze.  Gastro Intestinal  No nausea, vomitting, or diarrhoea. No bright red blood per rectum, no abdominal pain, change in bowel movement, or constipation.  Genito Urinary  No frequency, urgency, dysuria, no bleeding Musculo Skeletal  No myalgia, arthralgia, joint swelling or pain  Neurologic  No weakness, numbness, change in gait,  Psychology  No depression, anxiety,  insomnia.   Vitals:  Blood pressure (!) 118/48, pulse 67, temperature (!) 97.3 F (36.3 C), temperature source Tympanic, resp. rate 18, weight 148 lb (67.1 kg), SpO2 97 %.  Physical Exam: WD in NAD Neck  Supple NROM, without any enlargements.  Lymph Node Survey No cervical supraclavicular or inguinal adenopathy Cardiovascular  Pulse normal rate, regularity and rhythm. S1 and S2 normal.  Lungs  Clear to auscultation bilateraly, without wheezes/crackles/rhonchi. Good air movement.  Skin  No rash/lesions/breakdown  Psychiatry  Alert and oriented to person, place, and time  Abdomen  Normoactive bowel sounds, abdomen soft, non-tender and nonobese without evidence of hernia. Well healed incisions.  Back No CVA tenderness Genito Urinary  Vaginal cuff in tact, well healed, no lesions or palpable masses + prolapse (cystocele).  Rectal  deferred Extremities  No  bilateral cyanosis, clubbing or edema.    Thereasa Solo, MD  10/19/2020, 2:06 PM

## 2020-10-19 NOTE — Patient Instructions (Signed)
Please follow-up with your gynecologist in 6 months for a check up.  Please contact Dr Serita Grit office (at [phone]) in June after your gynecology appointment to request an appointment with her for December, 2022.

## 2020-10-31 DIAGNOSIS — J111 Influenza due to unidentified influenza virus with other respiratory manifestations: Secondary | ICD-10-CM | POA: Diagnosis not present

## 2020-10-31 DIAGNOSIS — J029 Acute pharyngitis, unspecified: Secondary | ICD-10-CM | POA: Diagnosis not present

## 2020-10-31 DIAGNOSIS — Z20828 Contact with and (suspected) exposure to other viral communicable diseases: Secondary | ICD-10-CM | POA: Diagnosis not present

## 2020-12-04 ENCOUNTER — Other Ambulatory Visit: Payer: Self-pay

## 2020-12-04 ENCOUNTER — Ambulatory Visit
Admission: RE | Admit: 2020-12-04 | Discharge: 2020-12-04 | Disposition: A | Discharge status: Home / Self Care | Payer: Medicare Other | Source: Ambulatory Visit | Attending: Family Medicine | Admitting: Family Medicine

## 2020-12-04 DIAGNOSIS — E2839 Other primary ovarian failure: Secondary | ICD-10-CM

## 2020-12-04 DIAGNOSIS — Z780 Asymptomatic menopausal state: Secondary | ICD-10-CM | POA: Diagnosis not present

## 2020-12-04 DIAGNOSIS — M85852 Other specified disorders of bone density and structure, left thigh: Secondary | ICD-10-CM | POA: Diagnosis not present

## 2021-01-02 DIAGNOSIS — H35033 Hypertensive retinopathy, bilateral: Secondary | ICD-10-CM | POA: Diagnosis not present

## 2021-01-02 DIAGNOSIS — E119 Type 2 diabetes mellitus without complications: Secondary | ICD-10-CM | POA: Diagnosis not present

## 2021-01-02 DIAGNOSIS — Z9849 Cataract extraction status, unspecified eye: Secondary | ICD-10-CM | POA: Diagnosis not present

## 2021-01-02 DIAGNOSIS — I10 Essential (primary) hypertension: Secondary | ICD-10-CM | POA: Diagnosis not present

## 2021-01-02 DIAGNOSIS — Z961 Presence of intraocular lens: Secondary | ICD-10-CM | POA: Diagnosis not present

## 2021-01-02 DIAGNOSIS — H524 Presbyopia: Secondary | ICD-10-CM | POA: Diagnosis not present

## 2021-01-02 DIAGNOSIS — H52223 Regular astigmatism, bilateral: Secondary | ICD-10-CM | POA: Diagnosis not present

## 2021-01-02 DIAGNOSIS — H5203 Hypermetropia, bilateral: Secondary | ICD-10-CM | POA: Diagnosis not present

## 2021-02-13 DIAGNOSIS — E782 Mixed hyperlipidemia: Secondary | ICD-10-CM | POA: Diagnosis not present

## 2021-02-13 DIAGNOSIS — E039 Hypothyroidism, unspecified: Secondary | ICD-10-CM | POA: Diagnosis not present

## 2021-02-13 DIAGNOSIS — E1169 Type 2 diabetes mellitus with other specified complication: Secondary | ICD-10-CM | POA: Diagnosis not present

## 2021-02-13 DIAGNOSIS — I10 Essential (primary) hypertension: Secondary | ICD-10-CM | POA: Diagnosis not present

## 2021-03-21 DIAGNOSIS — U071 COVID-19: Secondary | ICD-10-CM | POA: Diagnosis not present

## 2021-05-25 DIAGNOSIS — Z8542 Personal history of malignant neoplasm of other parts of uterus: Secondary | ICD-10-CM | POA: Diagnosis not present

## 2021-05-25 DIAGNOSIS — L900 Lichen sclerosus et atrophicus: Secondary | ICD-10-CM | POA: Diagnosis not present

## 2021-05-25 DIAGNOSIS — Z01419 Encounter for gynecological examination (general) (routine) without abnormal findings: Secondary | ICD-10-CM | POA: Diagnosis not present

## 2021-06-01 ENCOUNTER — Other Ambulatory Visit: Payer: Self-pay | Admitting: Obstetrics and Gynecology

## 2021-06-01 DIAGNOSIS — Z1231 Encounter for screening mammogram for malignant neoplasm of breast: Secondary | ICD-10-CM

## 2021-06-15 ENCOUNTER — Other Ambulatory Visit: Payer: Self-pay

## 2021-06-15 ENCOUNTER — Ambulatory Visit
Admission: RE | Admit: 2021-06-15 | Discharge: 2021-06-15 | Disposition: A | Discharge status: Home / Self Care | Payer: Medicare Other | Source: Ambulatory Visit | Attending: Obstetrics and Gynecology | Admitting: Obstetrics and Gynecology

## 2021-06-15 DIAGNOSIS — Z1231 Encounter for screening mammogram for malignant neoplasm of breast: Secondary | ICD-10-CM | POA: Diagnosis not present

## 2021-06-17 DIAGNOSIS — N10 Acute tubulo-interstitial nephritis: Secondary | ICD-10-CM | POA: Diagnosis not present

## 2021-08-16 DIAGNOSIS — Z23 Encounter for immunization: Secondary | ICD-10-CM | POA: Diagnosis not present

## 2021-08-28 DIAGNOSIS — E782 Mixed hyperlipidemia: Secondary | ICD-10-CM | POA: Diagnosis not present

## 2021-08-28 DIAGNOSIS — I10 Essential (primary) hypertension: Secondary | ICD-10-CM | POA: Diagnosis not present

## 2021-08-28 DIAGNOSIS — F411 Generalized anxiety disorder: Secondary | ICD-10-CM | POA: Diagnosis not present

## 2021-08-28 DIAGNOSIS — Z8542 Personal history of malignant neoplasm of other parts of uterus: Secondary | ICD-10-CM | POA: Diagnosis not present

## 2021-08-28 DIAGNOSIS — Z23 Encounter for immunization: Secondary | ICD-10-CM | POA: Diagnosis not present

## 2021-08-28 DIAGNOSIS — E1169 Type 2 diabetes mellitus with other specified complication: Secondary | ICD-10-CM | POA: Diagnosis not present

## 2021-08-28 DIAGNOSIS — E039 Hypothyroidism, unspecified: Secondary | ICD-10-CM | POA: Diagnosis not present

## 2021-08-28 DIAGNOSIS — R809 Proteinuria, unspecified: Secondary | ICD-10-CM | POA: Diagnosis not present

## 2021-08-28 DIAGNOSIS — Z0000 Encounter for general adult medical examination without abnormal findings: Secondary | ICD-10-CM | POA: Diagnosis not present

## 2022-02-27 DIAGNOSIS — I10 Essential (primary) hypertension: Secondary | ICD-10-CM | POA: Diagnosis not present

## 2022-02-27 DIAGNOSIS — E782 Mixed hyperlipidemia: Secondary | ICD-10-CM | POA: Diagnosis not present

## 2022-02-27 DIAGNOSIS — E1169 Type 2 diabetes mellitus with other specified complication: Secondary | ICD-10-CM | POA: Diagnosis not present

## 2022-02-27 DIAGNOSIS — E039 Hypothyroidism, unspecified: Secondary | ICD-10-CM | POA: Diagnosis not present

## 2022-02-27 DIAGNOSIS — R809 Proteinuria, unspecified: Secondary | ICD-10-CM | POA: Diagnosis not present

## 2022-05-06 ENCOUNTER — Other Ambulatory Visit: Payer: Self-pay | Admitting: Obstetrics and Gynecology

## 2022-05-06 DIAGNOSIS — Z1231 Encounter for screening mammogram for malignant neoplasm of breast: Secondary | ICD-10-CM

## 2022-06-17 ENCOUNTER — Ambulatory Visit
Admission: RE | Admit: 2022-06-17 | Discharge: 2022-06-17 | Disposition: A | Discharge status: Home / Self Care | Payer: Medicare Other | Source: Ambulatory Visit | Attending: Obstetrics and Gynecology | Admitting: Obstetrics and Gynecology

## 2022-06-17 DIAGNOSIS — Z1231 Encounter for screening mammogram for malignant neoplasm of breast: Secondary | ICD-10-CM

## 2022-07-29 DIAGNOSIS — N904 Leukoplakia of vulva: Secondary | ICD-10-CM | POA: Diagnosis not present

## 2022-07-29 DIAGNOSIS — Z8542 Personal history of malignant neoplasm of other parts of uterus: Secondary | ICD-10-CM | POA: Diagnosis not present

## 2022-07-29 DIAGNOSIS — Z9189 Other specified personal risk factors, not elsewhere classified: Secondary | ICD-10-CM | POA: Diagnosis not present

## 2022-09-02 DIAGNOSIS — L299 Pruritus, unspecified: Secondary | ICD-10-CM | POA: Diagnosis not present

## 2022-09-02 DIAGNOSIS — F411 Generalized anxiety disorder: Secondary | ICD-10-CM | POA: Diagnosis not present

## 2022-09-02 DIAGNOSIS — E782 Mixed hyperlipidemia: Secondary | ICD-10-CM | POA: Diagnosis not present

## 2022-09-02 DIAGNOSIS — R809 Proteinuria, unspecified: Secondary | ICD-10-CM | POA: Diagnosis not present

## 2022-09-02 DIAGNOSIS — Z0000 Encounter for general adult medical examination without abnormal findings: Secondary | ICD-10-CM | POA: Diagnosis not present

## 2022-09-02 DIAGNOSIS — Z23 Encounter for immunization: Secondary | ICD-10-CM | POA: Diagnosis not present

## 2022-09-02 DIAGNOSIS — I10 Essential (primary) hypertension: Secondary | ICD-10-CM | POA: Diagnosis not present

## 2022-09-02 DIAGNOSIS — M8580 Other specified disorders of bone density and structure, unspecified site: Secondary | ICD-10-CM | POA: Diagnosis not present

## 2022-09-02 DIAGNOSIS — Z8542 Personal history of malignant neoplasm of other parts of uterus: Secondary | ICD-10-CM | POA: Diagnosis not present

## 2022-09-02 DIAGNOSIS — E039 Hypothyroidism, unspecified: Secondary | ICD-10-CM | POA: Diagnosis not present

## 2022-09-02 DIAGNOSIS — E1169 Type 2 diabetes mellitus with other specified complication: Secondary | ICD-10-CM | POA: Diagnosis not present

## 2022-09-03 ENCOUNTER — Other Ambulatory Visit: Payer: Self-pay | Admitting: Family Medicine

## 2022-09-03 DIAGNOSIS — M8580 Other specified disorders of bone density and structure, unspecified site: Secondary | ICD-10-CM

## 2022-12-12 DIAGNOSIS — R059 Cough, unspecified: Secondary | ICD-10-CM | POA: Diagnosis not present

## 2022-12-12 DIAGNOSIS — M791 Myalgia, unspecified site: Secondary | ICD-10-CM | POA: Diagnosis not present

## 2022-12-12 DIAGNOSIS — U071 COVID-19: Secondary | ICD-10-CM | POA: Diagnosis not present

## 2022-12-12 DIAGNOSIS — R0981 Nasal congestion: Secondary | ICD-10-CM | POA: Diagnosis not present

## 2023-02-25 ENCOUNTER — Ambulatory Visit
Admission: RE | Admit: 2023-02-25 | Discharge: 2023-02-25 | Disposition: A | Discharge status: Home / Self Care | Payer: Medicare Other | Source: Ambulatory Visit | Attending: Family Medicine | Admitting: Family Medicine

## 2023-02-25 DIAGNOSIS — M8580 Other specified disorders of bone density and structure, unspecified site: Secondary | ICD-10-CM

## 2023-02-25 DIAGNOSIS — Z780 Asymptomatic menopausal state: Secondary | ICD-10-CM | POA: Diagnosis not present

## 2023-02-25 DIAGNOSIS — M85852 Other specified disorders of bone density and structure, left thigh: Secondary | ICD-10-CM | POA: Diagnosis not present

## 2023-05-26 ENCOUNTER — Other Ambulatory Visit: Payer: Self-pay | Admitting: Family Medicine

## 2023-05-26 DIAGNOSIS — Z1231 Encounter for screening mammogram for malignant neoplasm of breast: Secondary | ICD-10-CM

## 2023-06-20 ENCOUNTER — Ambulatory Visit: Payer: Medicare Other

## 2023-07-02 ENCOUNTER — Ambulatory Visit
Admission: RE | Admit: 2023-07-02 | Discharge: 2023-07-02 | Disposition: A | Discharge status: Home / Self Care | Payer: Medicare Other | Source: Ambulatory Visit | Attending: Family Medicine | Admitting: Family Medicine

## 2023-07-02 DIAGNOSIS — Z1231 Encounter for screening mammogram for malignant neoplasm of breast: Secondary | ICD-10-CM | POA: Diagnosis not present

## 2023-08-01 IMAGING — MG MM DIGITAL SCREENING BILAT W/ TOMO AND CAD
6 of 12 series · 6 of 36 positions shown · non-contrast
Comparison: Previous exam(s).

CLINICAL DATA: Screening.

EXAM:
DIGITAL SCREENING BILATERAL MAMMOGRAM WITH TOMOSYNTHESIS AND CAD
TECHNIQUE: Bilateral screening digital craniocaudal and mediolateral oblique
mammograms were obtained. Bilateral screening digital breast
tomosynthesis was performed. The images were evaluated with
computer-aided detection.

[L CC synth-2D (1 of 2)]
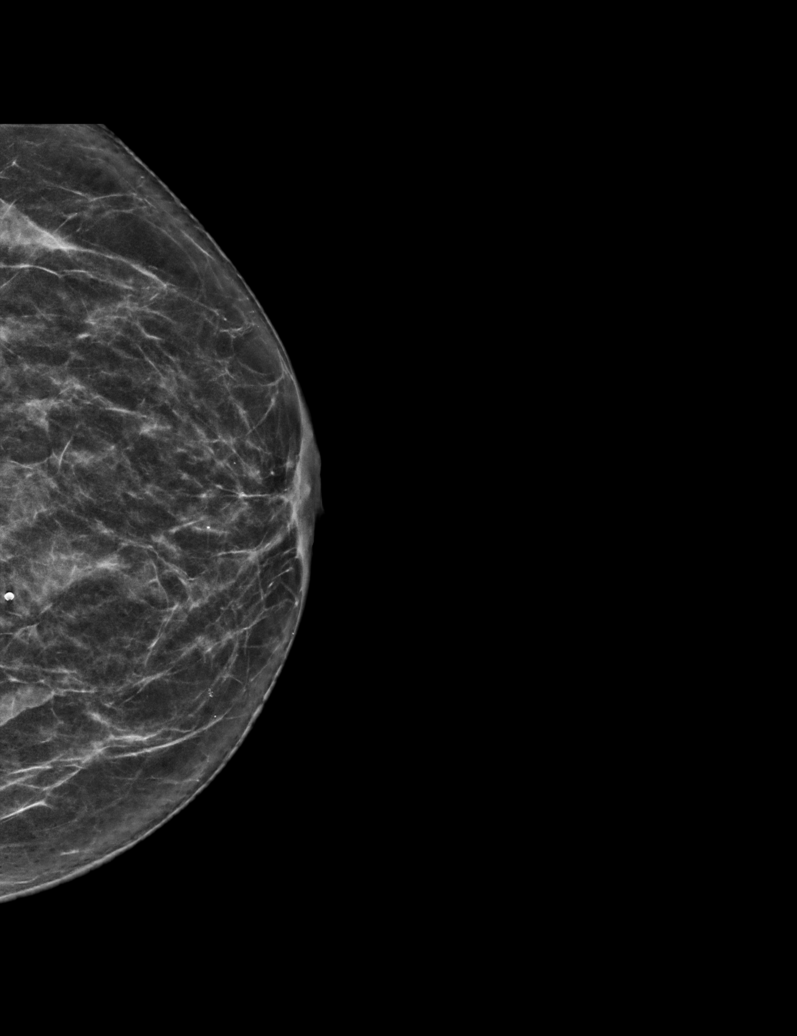

[R MLO synth-2D]
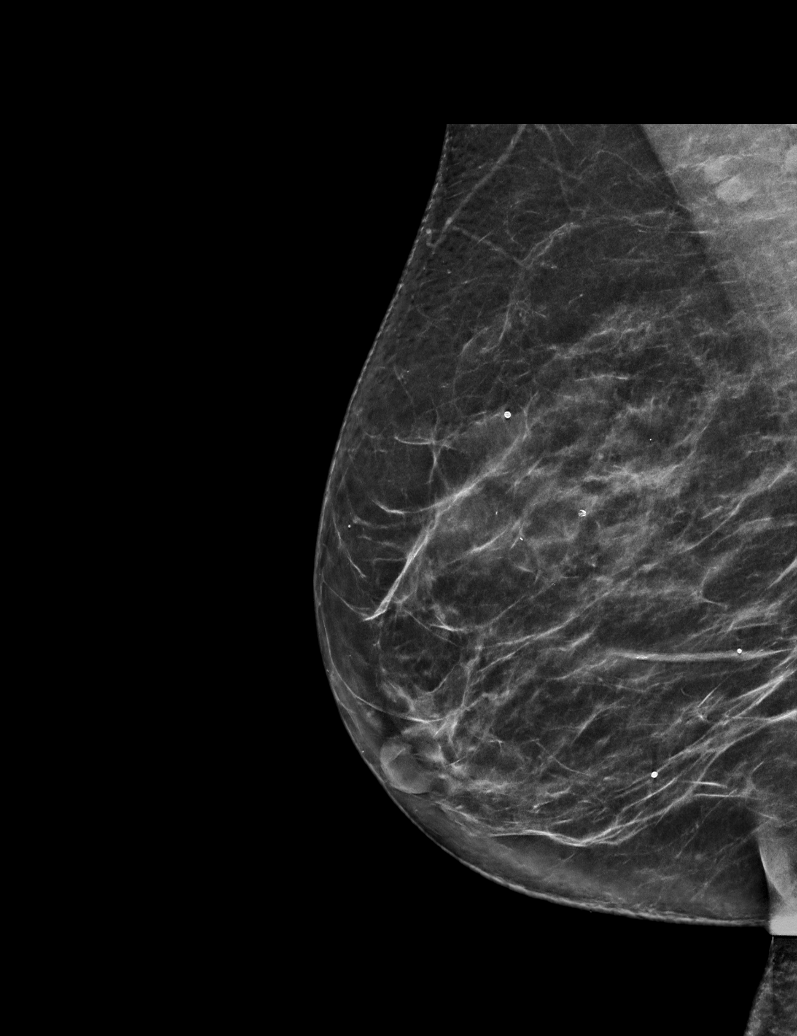

[R CC synth-2D (1 of 2)]
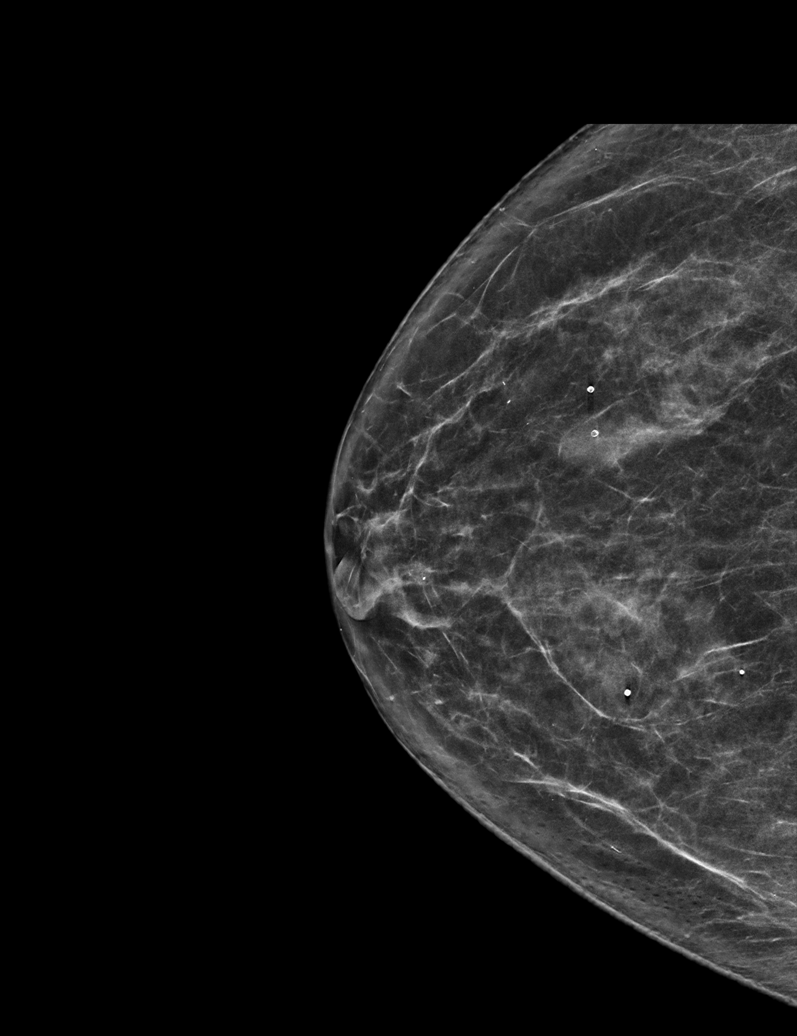

[R CC synth-2D (2 of 2)]
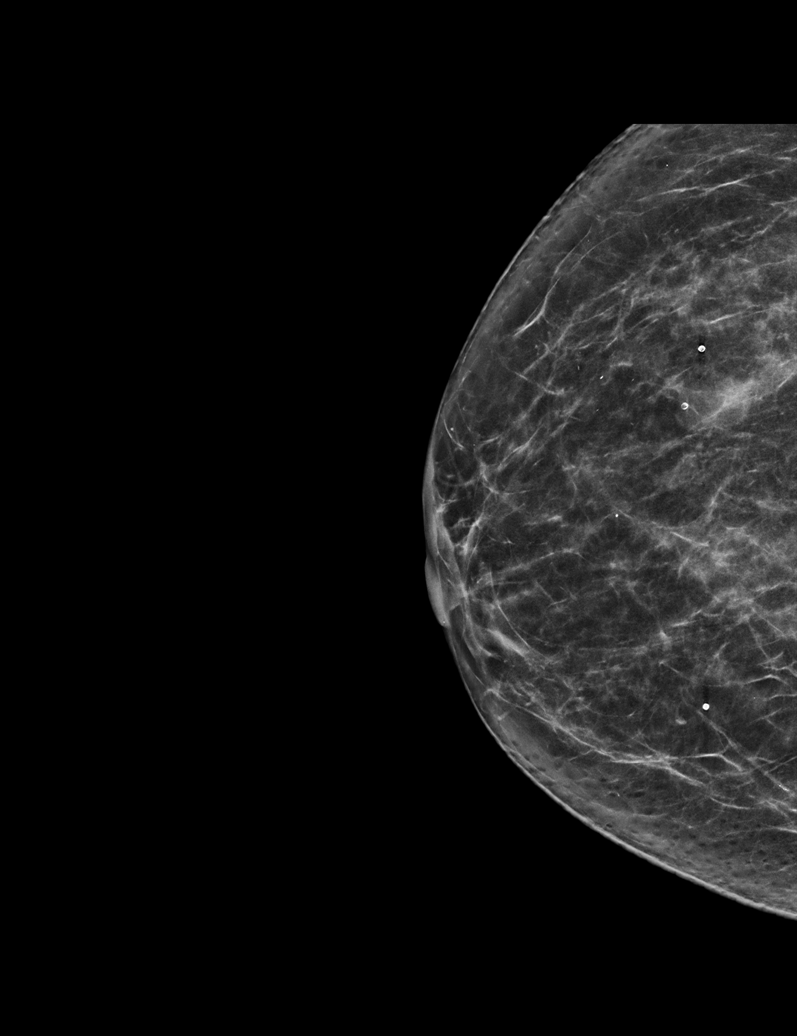

[L CC synth-2D (2 of 2)]
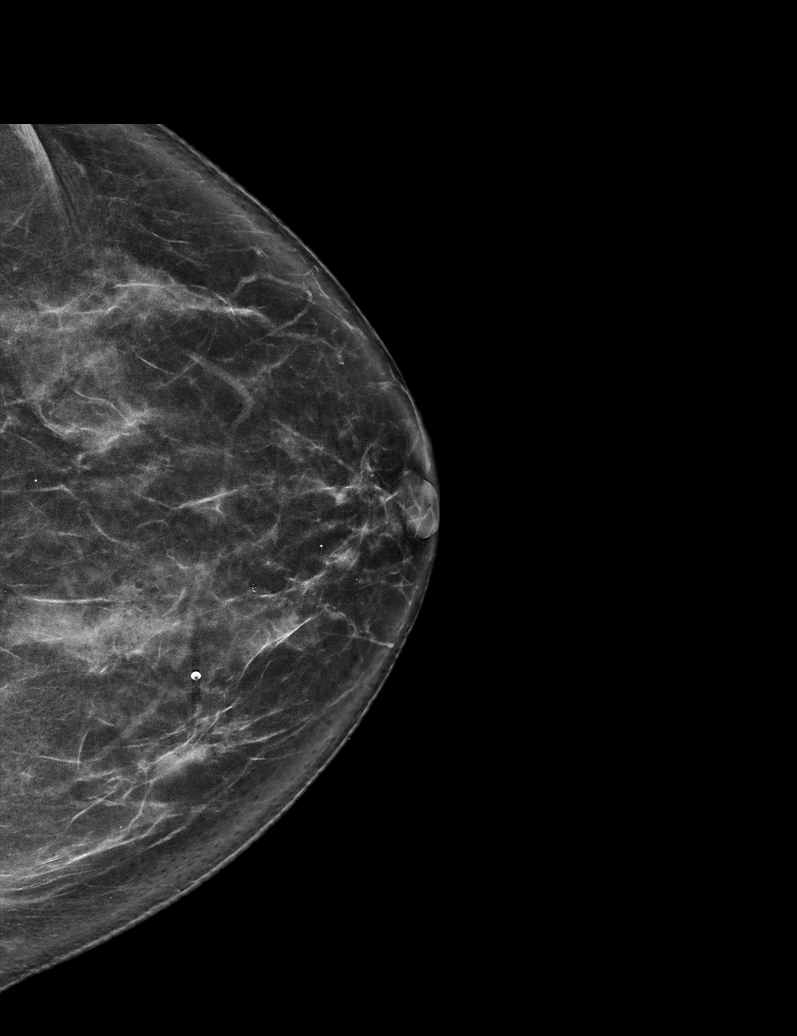

[L MLO synth-2D]
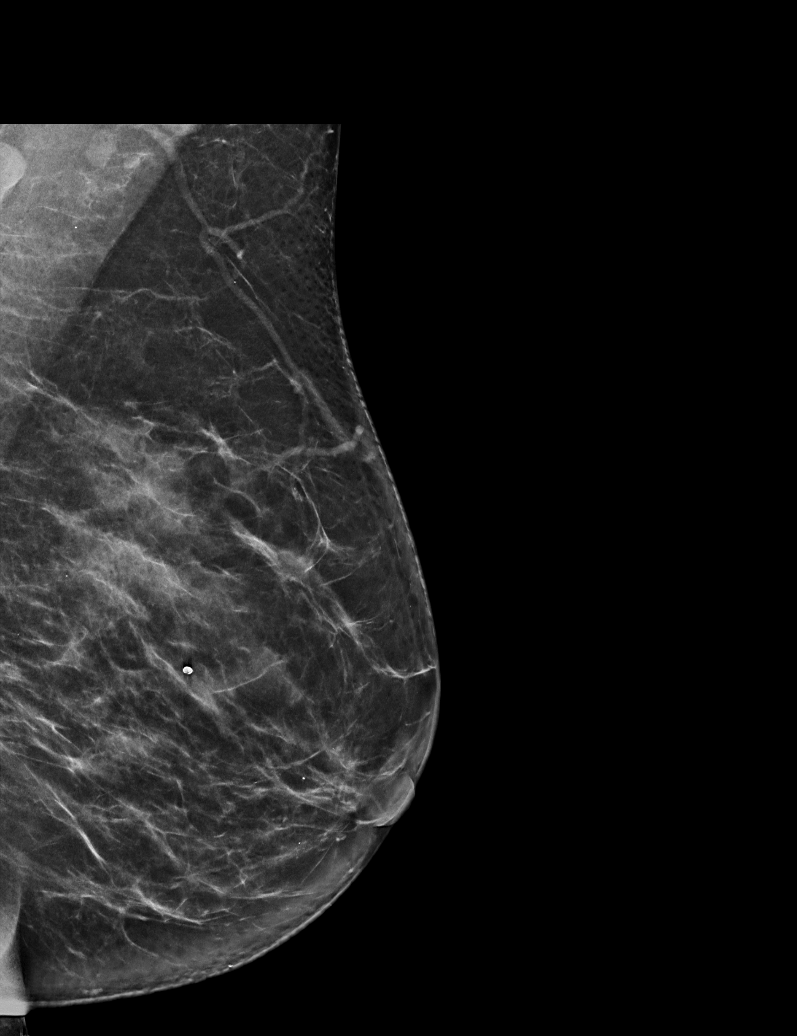

[6 of 36 positions shown; findings below may reference images not displayed]

ACR Breast Density Category b: There are scattered areas of
fibroglandular density.
FINDINGS: There are no findings suspicious for malignancy.
IMPRESSION: No mammographic evidence of malignancy. A result letter of this
screening mammogram will be mailed directly to the patient.

RECOMMENDATION:
Screening mammogram in one year. (Code:51-O-LD2)

BI-RADS CATEGORY  1: Negative.

## 2023-09-08 DIAGNOSIS — E1122 Type 2 diabetes mellitus with diabetic chronic kidney disease: Secondary | ICD-10-CM | POA: Diagnosis not present

## 2023-09-08 DIAGNOSIS — R82998 Other abnormal findings in urine: Secondary | ICD-10-CM | POA: Diagnosis not present

## 2023-09-08 DIAGNOSIS — Z23 Encounter for immunization: Secondary | ICD-10-CM | POA: Diagnosis not present

## 2023-09-08 DIAGNOSIS — N181 Chronic kidney disease, stage 1: Secondary | ICD-10-CM | POA: Diagnosis not present

## 2023-09-08 DIAGNOSIS — F411 Generalized anxiety disorder: Secondary | ICD-10-CM | POA: Diagnosis not present

## 2023-09-08 DIAGNOSIS — M8580 Other specified disorders of bone density and structure, unspecified site: Secondary | ICD-10-CM | POA: Diagnosis not present

## 2023-09-08 DIAGNOSIS — M7061 Trochanteric bursitis, right hip: Secondary | ICD-10-CM | POA: Diagnosis not present

## 2023-09-08 DIAGNOSIS — Z0000 Encounter for general adult medical examination without abnormal findings: Secondary | ICD-10-CM | POA: Diagnosis not present

## 2023-09-08 DIAGNOSIS — E782 Mixed hyperlipidemia: Secondary | ICD-10-CM | POA: Diagnosis not present

## 2023-09-08 DIAGNOSIS — E039 Hypothyroidism, unspecified: Secondary | ICD-10-CM | POA: Diagnosis not present

## 2023-09-08 DIAGNOSIS — R5383 Other fatigue: Secondary | ICD-10-CM | POA: Diagnosis not present

## 2023-09-08 DIAGNOSIS — I10 Essential (primary) hypertension: Secondary | ICD-10-CM | POA: Diagnosis not present

## 2023-09-08 DIAGNOSIS — Z8542 Personal history of malignant neoplasm of other parts of uterus: Secondary | ICD-10-CM | POA: Diagnosis not present

## 2023-09-09 DIAGNOSIS — E083293 Diabetes mellitus due to underlying condition with mild nonproliferative diabetic retinopathy without macular edema, bilateral: Secondary | ICD-10-CM | POA: Diagnosis not present

## 2023-09-10 DIAGNOSIS — Z9189 Other specified personal risk factors, not elsewhere classified: Secondary | ICD-10-CM | POA: Diagnosis not present

## 2023-09-10 DIAGNOSIS — Z8542 Personal history of malignant neoplasm of other parts of uterus: Secondary | ICD-10-CM | POA: Diagnosis not present

## 2023-09-10 DIAGNOSIS — L900 Lichen sclerosus et atrophicus: Secondary | ICD-10-CM | POA: Diagnosis not present

## 2023-09-11 DIAGNOSIS — M7061 Trochanteric bursitis, right hip: Secondary | ICD-10-CM | POA: Diagnosis not present

## 2023-10-01 DIAGNOSIS — E039 Hypothyroidism, unspecified: Secondary | ICD-10-CM | POA: Diagnosis not present

## 2023-11-28 DIAGNOSIS — Z1211 Encounter for screening for malignant neoplasm of colon: Secondary | ICD-10-CM | POA: Diagnosis not present

## 2023-11-28 DIAGNOSIS — D125 Benign neoplasm of sigmoid colon: Secondary | ICD-10-CM | POA: Diagnosis not present

## 2023-11-28 DIAGNOSIS — D124 Benign neoplasm of descending colon: Secondary | ICD-10-CM | POA: Diagnosis not present

## 2023-11-28 DIAGNOSIS — K5730 Diverticulosis of large intestine without perforation or abscess without bleeding: Secondary | ICD-10-CM | POA: Diagnosis not present

## 2023-11-28 DIAGNOSIS — K635 Polyp of colon: Secondary | ICD-10-CM | POA: Diagnosis not present

## 2023-12-03 DIAGNOSIS — K635 Polyp of colon: Secondary | ICD-10-CM | POA: Diagnosis not present

## 2023-12-03 DIAGNOSIS — D125 Benign neoplasm of sigmoid colon: Secondary | ICD-10-CM | POA: Diagnosis not present

## 2023-12-03 DIAGNOSIS — D124 Benign neoplasm of descending colon: Secondary | ICD-10-CM | POA: Diagnosis not present

## 2024-01-09 DIAGNOSIS — I10 Essential (primary) hypertension: Secondary | ICD-10-CM | POA: Diagnosis not present

## 2024-01-09 DIAGNOSIS — Z23 Encounter for immunization: Secondary | ICD-10-CM | POA: Diagnosis not present

## 2024-01-09 DIAGNOSIS — E113293 Type 2 diabetes mellitus with mild nonproliferative diabetic retinopathy without macular edema, bilateral: Secondary | ICD-10-CM | POA: Diagnosis not present

## 2024-01-09 DIAGNOSIS — E1122 Type 2 diabetes mellitus with diabetic chronic kidney disease: Secondary | ICD-10-CM | POA: Diagnosis not present

## 2024-01-09 DIAGNOSIS — N181 Chronic kidney disease, stage 1: Secondary | ICD-10-CM | POA: Diagnosis not present

## 2024-01-09 DIAGNOSIS — E782 Mixed hyperlipidemia: Secondary | ICD-10-CM | POA: Diagnosis not present

## 2024-01-09 DIAGNOSIS — E039 Hypothyroidism, unspecified: Secondary | ICD-10-CM | POA: Diagnosis not present

## 2024-01-16 DIAGNOSIS — N181 Chronic kidney disease, stage 1: Secondary | ICD-10-CM | POA: Diagnosis not present

## 2024-01-16 DIAGNOSIS — I10 Essential (primary) hypertension: Secondary | ICD-10-CM | POA: Diagnosis not present

## 2024-01-16 DIAGNOSIS — E1122 Type 2 diabetes mellitus with diabetic chronic kidney disease: Secondary | ICD-10-CM | POA: Diagnosis not present

## 2024-02-09 DIAGNOSIS — E782 Mixed hyperlipidemia: Secondary | ICD-10-CM | POA: Diagnosis not present

## 2024-02-09 DIAGNOSIS — N181 Chronic kidney disease, stage 1: Secondary | ICD-10-CM | POA: Diagnosis not present

## 2024-02-09 DIAGNOSIS — Z8542 Personal history of malignant neoplasm of other parts of uterus: Secondary | ICD-10-CM | POA: Diagnosis not present

## 2024-02-09 DIAGNOSIS — I10 Essential (primary) hypertension: Secondary | ICD-10-CM | POA: Diagnosis not present

## 2024-02-09 DIAGNOSIS — E1122 Type 2 diabetes mellitus with diabetic chronic kidney disease: Secondary | ICD-10-CM | POA: Diagnosis not present

## 2024-02-12 DIAGNOSIS — E871 Hypo-osmolality and hyponatremia: Secondary | ICD-10-CM | POA: Diagnosis not present

## 2024-02-12 DIAGNOSIS — E039 Hypothyroidism, unspecified: Secondary | ICD-10-CM | POA: Diagnosis not present

## 2024-02-14 DIAGNOSIS — I10 Essential (primary) hypertension: Secondary | ICD-10-CM | POA: Diagnosis not present

## 2024-02-14 DIAGNOSIS — E1122 Type 2 diabetes mellitus with diabetic chronic kidney disease: Secondary | ICD-10-CM | POA: Diagnosis not present

## 2024-02-14 DIAGNOSIS — N181 Chronic kidney disease, stage 1: Secondary | ICD-10-CM | POA: Diagnosis not present

## 2024-03-10 DIAGNOSIS — E1122 Type 2 diabetes mellitus with diabetic chronic kidney disease: Secondary | ICD-10-CM | POA: Diagnosis not present

## 2024-03-10 DIAGNOSIS — I10 Essential (primary) hypertension: Secondary | ICD-10-CM | POA: Diagnosis not present

## 2024-03-10 DIAGNOSIS — Z8542 Personal history of malignant neoplasm of other parts of uterus: Secondary | ICD-10-CM | POA: Diagnosis not present

## 2024-03-10 DIAGNOSIS — N181 Chronic kidney disease, stage 1: Secondary | ICD-10-CM | POA: Diagnosis not present

## 2024-03-10 DIAGNOSIS — E782 Mixed hyperlipidemia: Secondary | ICD-10-CM | POA: Diagnosis not present

## 2024-03-15 DIAGNOSIS — N181 Chronic kidney disease, stage 1: Secondary | ICD-10-CM | POA: Diagnosis not present

## 2024-03-15 DIAGNOSIS — E1122 Type 2 diabetes mellitus with diabetic chronic kidney disease: Secondary | ICD-10-CM | POA: Diagnosis not present

## 2024-03-15 DIAGNOSIS — I10 Essential (primary) hypertension: Secondary | ICD-10-CM | POA: Diagnosis not present

## 2024-04-10 DIAGNOSIS — E1122 Type 2 diabetes mellitus with diabetic chronic kidney disease: Secondary | ICD-10-CM | POA: Diagnosis not present

## 2024-04-10 DIAGNOSIS — Z8542 Personal history of malignant neoplasm of other parts of uterus: Secondary | ICD-10-CM | POA: Diagnosis not present

## 2024-04-10 DIAGNOSIS — E782 Mixed hyperlipidemia: Secondary | ICD-10-CM | POA: Diagnosis not present

## 2024-04-10 DIAGNOSIS — I10 Essential (primary) hypertension: Secondary | ICD-10-CM | POA: Diagnosis not present

## 2024-04-10 DIAGNOSIS — N181 Chronic kidney disease, stage 1: Secondary | ICD-10-CM | POA: Diagnosis not present

## 2024-04-14 DIAGNOSIS — N181 Chronic kidney disease, stage 1: Secondary | ICD-10-CM | POA: Diagnosis not present

## 2024-04-14 DIAGNOSIS — E1122 Type 2 diabetes mellitus with diabetic chronic kidney disease: Secondary | ICD-10-CM | POA: Diagnosis not present

## 2024-04-14 DIAGNOSIS — I10 Essential (primary) hypertension: Secondary | ICD-10-CM | POA: Diagnosis not present

## 2024-04-30 ENCOUNTER — Telehealth: Payer: Self-pay | Admitting: Pharmacist

## 2024-04-30 DIAGNOSIS — N181 Chronic kidney disease, stage 1: Secondary | ICD-10-CM | POA: Diagnosis not present

## 2024-04-30 DIAGNOSIS — E782 Mixed hyperlipidemia: Secondary | ICD-10-CM | POA: Diagnosis not present

## 2024-04-30 DIAGNOSIS — E039 Hypothyroidism, unspecified: Secondary | ICD-10-CM | POA: Diagnosis not present

## 2024-04-30 DIAGNOSIS — E1122 Type 2 diabetes mellitus with diabetic chronic kidney disease: Secondary | ICD-10-CM | POA: Diagnosis not present

## 2024-04-30 DIAGNOSIS — I10 Essential (primary) hypertension: Secondary | ICD-10-CM | POA: Diagnosis not present

## 2024-04-30 NOTE — Progress Notes (Signed)
   04/30/2024  Patient ID: Kiara Kelley, female   DOB: 12-23-1950, 73 y.o.   MRN: [id number]  Anikin Prosser - See my OV note on this patient from 6/20. Would like to get her started on Rybelsus or Farxiga.  Tried Rybelsus earlier this year and her coinsurance was quite high and our MAP team wasn't able to help her navigate that.  Can you help?  If cannot get Rybelsus at a reasonable price would like to try for [place] or Jardiance given her CKD.  Thanks!  Called and spoke with the patient on the phone today regarding medication cost options. She only has traditional medicare coverage for medications, which means she will not qualify for any brand name medications. Discussed yearly income and she is above the cut-off for PAP programs for Rybelsus or Farxiga/Jardiance. Therefore, cheapest option is to get Brenzavvy for $50 for 30 tablets from [facility]. Advised she can go ahead and create the account, and we can send the medication for 30 tablets there for her to try.  Briefly reviewed how the medication works and to stay well hydrated. Confirmed understanding. Advised if she is not able to get the script processed by Wednesday to give us  a call at the office.  Dr. Bernetta Brilliant, patient would only be able to try Brenzavvy from [facility] pharmacy for $50 (similar to Farxiga/Jardiance). If appropriate, could we please send (attached in TE)? Thank you!   Delvin File, PharmD [facility]  Phone Number: [phone]

## 2024-05-06 ENCOUNTER — Telehealth: Payer: Self-pay | Admitting: Pharmacist

## 2024-05-06 NOTE — Progress Notes (Addendum)
   05/06/2024  Patient ID: Kiara Kelley, female   DOB: [birth year], 73 y.o.   MRN: [id number]  Follow-up on Brenzavvy start (able to get from [facility] pharmacy?)  Called patient for med start follow-up. Unable to reach at this time. Left voicemail requesting call back at earliest convenience.  Update from 05/07/24: Patient returned my call this morning. Reports she was thinking about the medication a little further for weight loss. Regarding concerns, if not history of recurrent UTI or yeast infections recently, this is unlikely to occur. With uncontrolled DM it happens a lot because the excess sugar in the urine can increase risk of infection occurrence. For hypoglycemia, it usually helps with mealtime blood sugars. It should not lower it enough to cause low blood sugar by its mechanism. Can increase frequency of urination however, which has been a constant problem after endometrial cancer anyways. Can cause lower blood pressure, so have to stay hydrated and may remove any BP meds (if taking any).  Reports trying berberine prior, which made her nauseous. Therefore, Rybelsus would have a similar effect. Reports only requesting the medication for weight loss help.   Ended call by stating that she will think about it a little more and get the script if she would like to proceed. Pharmacy has it. She would just have to request it.   Aloysius Lewis, PharmD [facility]  Phone Number: [phone]

## 2024-05-19 ENCOUNTER — Other Ambulatory Visit: Payer: Self-pay | Admitting: Family Medicine

## 2024-05-19 DIAGNOSIS — Z1231 Encounter for screening mammogram for malignant neoplasm of breast: Secondary | ICD-10-CM

## 2024-07-02 ENCOUNTER — Ambulatory Visit
Admission: RE | Admit: 2024-07-02 | Discharge: 2024-07-02 | Disposition: A | Discharge status: Home / Self Care | Source: Ambulatory Visit | Attending: Family Medicine | Admitting: Family Medicine

## 2024-07-02 DIAGNOSIS — Z1231 Encounter for screening mammogram for malignant neoplasm of breast: Secondary | ICD-10-CM

## 2024-09-09 DIAGNOSIS — Z0000 Encounter for general adult medical examination without abnormal findings: Secondary | ICD-10-CM | POA: Diagnosis not present

## 2024-09-09 DIAGNOSIS — N181 Chronic kidney disease, stage 1: Secondary | ICD-10-CM | POA: Diagnosis not present

## 2024-09-09 DIAGNOSIS — Z1331 Encounter for screening for depression: Secondary | ICD-10-CM | POA: Diagnosis not present

## 2024-09-09 DIAGNOSIS — M8580 Other specified disorders of bone density and structure, unspecified site: Secondary | ICD-10-CM | POA: Diagnosis not present

## 2024-09-09 DIAGNOSIS — E1122 Type 2 diabetes mellitus with diabetic chronic kidney disease: Secondary | ICD-10-CM | POA: Diagnosis not present

## 2024-09-09 DIAGNOSIS — I10 Essential (primary) hypertension: Secondary | ICD-10-CM | POA: Diagnosis not present

## 2024-09-09 DIAGNOSIS — Z23 Encounter for immunization: Secondary | ICD-10-CM | POA: Diagnosis not present

## 2024-09-09 DIAGNOSIS — E039 Hypothyroidism, unspecified: Secondary | ICD-10-CM | POA: Diagnosis not present

## 2024-09-09 DIAGNOSIS — Z8542 Personal history of malignant neoplasm of other parts of uterus: Secondary | ICD-10-CM | POA: Diagnosis not present

## 2024-09-09 DIAGNOSIS — E782 Mixed hyperlipidemia: Secondary | ICD-10-CM | POA: Diagnosis not present

## 2024-09-09 DIAGNOSIS — F411 Generalized anxiety disorder: Secondary | ICD-10-CM | POA: Diagnosis not present

## 2024-09-14 DIAGNOSIS — L900 Lichen sclerosus et atrophicus: Secondary | ICD-10-CM | POA: Diagnosis not present

## 2024-09-14 DIAGNOSIS — Z01419 Encounter for gynecological examination (general) (routine) without abnormal findings: Secondary | ICD-10-CM | POA: Diagnosis not present

## 2024-09-14 DIAGNOSIS — N8110 Cystocele, unspecified: Secondary | ICD-10-CM | POA: Diagnosis not present

## 2024-09-14 DIAGNOSIS — Z8542 Personal history of malignant neoplasm of other parts of uterus: Secondary | ICD-10-CM | POA: Diagnosis not present

## 2024-09-14 DIAGNOSIS — N952 Postmenopausal atrophic vaginitis: Secondary | ICD-10-CM | POA: Diagnosis not present

## 2024-09-14 DIAGNOSIS — M8580 Other specified disorders of bone density and structure, unspecified site: Secondary | ICD-10-CM | POA: Diagnosis not present

## 2024-09-14 DIAGNOSIS — Z9189 Other specified personal risk factors, not elsewhere classified: Secondary | ICD-10-CM | POA: Diagnosis not present

## 2024-09-24 DIAGNOSIS — H35033 Hypertensive retinopathy, bilateral: Secondary | ICD-10-CM | POA: Diagnosis not present

## 2024-09-24 DIAGNOSIS — H524 Presbyopia: Secondary | ICD-10-CM | POA: Diagnosis not present

## 2024-09-24 DIAGNOSIS — H43393 Other vitreous opacities, bilateral: Secondary | ICD-10-CM | POA: Diagnosis not present

## 2024-09-24 DIAGNOSIS — E119 Type 2 diabetes mellitus without complications: Secondary | ICD-10-CM | POA: Diagnosis not present

## 2024-09-24 DIAGNOSIS — Z961 Presence of intraocular lens: Secondary | ICD-10-CM | POA: Diagnosis not present

## 2024-09-24 DIAGNOSIS — E113293 Type 2 diabetes mellitus with mild nonproliferative diabetic retinopathy without macular edema, bilateral: Secondary | ICD-10-CM | POA: Diagnosis not present

## 2024-09-24 DIAGNOSIS — H52223 Regular astigmatism, bilateral: Secondary | ICD-10-CM | POA: Diagnosis not present

## 2024-09-24 DIAGNOSIS — H5203 Hypermetropia, bilateral: Secondary | ICD-10-CM | POA: Diagnosis not present

## 2024-09-24 DIAGNOSIS — I10 Essential (primary) hypertension: Secondary | ICD-10-CM | POA: Diagnosis not present

## 2024-09-24 DIAGNOSIS — Z9849 Cataract extraction status, unspecified eye: Secondary | ICD-10-CM | POA: Diagnosis not present

## 2024-09-24 DIAGNOSIS — H53143 Visual discomfort, bilateral: Secondary | ICD-10-CM | POA: Diagnosis not present
